# Patient Record
Sex: Female | Born: 1935 | Race: White | Hispanic: No | Smoking: Never smoker
Health system: Southern US, Community
[De-identification: ages and names within clinical notes are randomized; demographics above are authoritative.]

## PROBLEM LIST (undated history)

## (undated) DIAGNOSIS — F0390 Unspecified dementia without behavioral disturbance: Secondary | ICD-10-CM

## (undated) DIAGNOSIS — I10 Essential (primary) hypertension: Secondary | ICD-10-CM

## (undated) DIAGNOSIS — E785 Hyperlipidemia, unspecified: Secondary | ICD-10-CM

## (undated) DIAGNOSIS — H9190 Unspecified hearing loss, unspecified ear: Secondary | ICD-10-CM

## (undated) DIAGNOSIS — K219 Gastro-esophageal reflux disease without esophagitis: Secondary | ICD-10-CM

## (undated) DIAGNOSIS — E119 Type 2 diabetes mellitus without complications: Secondary | ICD-10-CM

## (undated) DIAGNOSIS — I2510 Atherosclerotic heart disease of native coronary artery without angina pectoris: Secondary | ICD-10-CM

---

## 2015-04-30 DIAGNOSIS — K219 Gastro-esophageal reflux disease without esophagitis: Secondary | ICD-10-CM | POA: Diagnosis present

## 2015-04-30 DIAGNOSIS — I2510 Atherosclerotic heart disease of native coronary artery without angina pectoris: Secondary | ICD-10-CM | POA: Diagnosis present

## 2015-04-30 DIAGNOSIS — S22000A Wedge compression fracture of unspecified thoracic vertebra, initial encounter for closed fracture: Secondary | ICD-10-CM | POA: Insufficient documentation

## 2015-04-30 DIAGNOSIS — E785 Hyperlipidemia, unspecified: Secondary | ICD-10-CM | POA: Diagnosis present

## 2017-12-09 ENCOUNTER — Emergency Department (HOSPITAL_BASED_OUTPATIENT_CLINIC_OR_DEPARTMENT_OTHER): Payer: Medicare Other

## 2017-12-09 ENCOUNTER — Other Ambulatory Visit: Payer: Self-pay

## 2017-12-09 ENCOUNTER — Encounter (HOSPITAL_BASED_OUTPATIENT_CLINIC_OR_DEPARTMENT_OTHER): Payer: Self-pay | Admitting: *Deleted

## 2017-12-09 ENCOUNTER — Inpatient Hospital Stay (HOSPITAL_BASED_OUTPATIENT_CLINIC_OR_DEPARTMENT_OTHER)
Admission: EM | Admit: 2017-12-09 | Discharge: 2017-12-12 | DRG: 871 | Disposition: A | Discharge status: Home / Self Care | Payer: Medicare Other | Attending: Internal Medicine | Admitting: Internal Medicine

## 2017-12-09 DIAGNOSIS — R011 Cardiac murmur, unspecified: Secondary | ICD-10-CM | POA: Diagnosis present

## 2017-12-09 DIAGNOSIS — H9190 Unspecified hearing loss, unspecified ear: Secondary | ICD-10-CM | POA: Diagnosis present

## 2017-12-09 DIAGNOSIS — I351 Nonrheumatic aortic (valve) insufficiency: Secondary | ICD-10-CM | POA: Diagnosis not present

## 2017-12-09 DIAGNOSIS — F0280 Dementia in other diseases classified elsewhere without behavioral disturbance: Secondary | ICD-10-CM | POA: Diagnosis present

## 2017-12-09 DIAGNOSIS — A419 Sepsis, unspecified organism: Secondary | ICD-10-CM | POA: Diagnosis present

## 2017-12-09 DIAGNOSIS — F05 Delirium due to known physiological condition: Secondary | ICD-10-CM | POA: Diagnosis present

## 2017-12-09 DIAGNOSIS — J9601 Acute respiratory failure with hypoxia: Secondary | ICD-10-CM | POA: Diagnosis present

## 2017-12-09 DIAGNOSIS — E739 Lactose intolerance, unspecified: Secondary | ICD-10-CM | POA: Diagnosis present

## 2017-12-09 DIAGNOSIS — R0902 Hypoxemia: Secondary | ICD-10-CM | POA: Diagnosis not present

## 2017-12-09 DIAGNOSIS — Z885 Allergy status to narcotic agent status: Secondary | ICD-10-CM | POA: Diagnosis not present

## 2017-12-09 DIAGNOSIS — Z79899 Other long term (current) drug therapy: Secondary | ICD-10-CM | POA: Diagnosis not present

## 2017-12-09 DIAGNOSIS — Z882 Allergy status to sulfonamides status: Secondary | ICD-10-CM | POA: Diagnosis not present

## 2017-12-09 DIAGNOSIS — I10 Essential (primary) hypertension: Secondary | ICD-10-CM | POA: Diagnosis present

## 2017-12-09 DIAGNOSIS — R112 Nausea with vomiting, unspecified: Secondary | ICD-10-CM | POA: Diagnosis present

## 2017-12-09 DIAGNOSIS — J189 Pneumonia, unspecified organism: Secondary | ICD-10-CM | POA: Diagnosis present

## 2017-12-09 DIAGNOSIS — Z888 Allergy status to other drugs, medicaments and biological substances status: Secondary | ICD-10-CM | POA: Diagnosis not present

## 2017-12-09 DIAGNOSIS — R197 Diarrhea, unspecified: Secondary | ICD-10-CM | POA: Diagnosis not present

## 2017-12-09 DIAGNOSIS — F0390 Unspecified dementia without behavioral disturbance: Secondary | ICD-10-CM | POA: Diagnosis not present

## 2017-12-09 DIAGNOSIS — R410 Disorientation, unspecified: Secondary | ICD-10-CM | POA: Diagnosis present

## 2017-12-09 DIAGNOSIS — G309 Alzheimer's disease, unspecified: Secondary | ICD-10-CM | POA: Diagnosis present

## 2017-12-09 HISTORY — DX: Essential (primary) hypertension: I10

## 2017-12-09 HISTORY — DX: Unspecified dementia, unspecified severity, without behavioral disturbance, psychotic disturbance, mood disturbance, and anxiety: F03.90

## 2017-12-09 LAB — CBC WITH DIFFERENTIAL/PLATELET
BASOS ABS: 0 10*3/uL (ref 0.0–0.1)
BASOS PCT: 0 %
Eosinophils Absolute: 0 10*3/uL (ref 0.0–0.7)
Eosinophils Relative: 0 %
HCT: 39.9 % (ref 36.0–46.0)
HEMOGLOBIN: 14 g/dL (ref 12.0–15.0)
Lymphocytes Relative: 2 %
Lymphs Abs: 0.3 10*3/uL — ABNORMAL LOW (ref 0.7–4.0)
MCH: 33 pg (ref 26.0–34.0)
MCHC: 35.1 g/dL (ref 30.0–36.0)
MCV: 94.1 fL (ref 78.0–100.0)
Monocytes Absolute: 0.8 10*3/uL (ref 0.1–1.0)
Monocytes Relative: 6 %
NEUTROS ABS: 13.5 10*3/uL — AB (ref 1.7–7.7)
NEUTROS PCT: 92 %
Platelets: 138 10*3/uL — ABNORMAL LOW (ref 150–400)
RBC: 4.24 MIL/uL (ref 3.87–5.11)
RDW: 12.2 % (ref 11.5–15.5)
WBC: 14.6 10*3/uL — ABNORMAL HIGH (ref 4.0–10.5)

## 2017-12-09 LAB — URINALYSIS, ROUTINE W REFLEX MICROSCOPIC
Bilirubin Urine: NEGATIVE
Glucose, UA: >=500 mg/dL — AB
KETONES UR: NEGATIVE mg/dL
LEUKOCYTES UA: NEGATIVE
NITRITE: NEGATIVE
Protein, ur: NEGATIVE mg/dL
SPECIFIC GRAVITY, URINE: 1.015 (ref 1.005–1.030)
pH: 7 (ref 5.0–8.0)

## 2017-12-09 LAB — COMPREHENSIVE METABOLIC PANEL
ALBUMIN: 3.9 g/dL (ref 3.5–5.0)
ALT: 16 U/L (ref 0–44)
ANION GAP: 10 (ref 5–15)
AST: 31 U/L (ref 15–41)
Alkaline Phosphatase: 68 U/L (ref 38–126)
BILIRUBIN TOTAL: 1.3 mg/dL — AB (ref 0.3–1.2)
BUN: 14 mg/dL (ref 8–23)
CHLORIDE: 95 mmol/L — AB (ref 98–111)
CO2: 27 mmol/L (ref 22–32)
Calcium: 8.7 mg/dL — ABNORMAL LOW (ref 8.9–10.3)
Creatinine, Ser: 0.62 mg/dL (ref 0.44–1.00)
GFR calc Af Amer: >60 mL/min (ref >60)
GLUCOSE: 208 mg/dL — AB (ref 70–99)
Potassium: 4.3 mmol/L (ref 3.5–5.1)
Sodium: 132 mmol/L — ABNORMAL LOW (ref 135–145)
TOTAL PROTEIN: 7.3 g/dL (ref 6.5–8.1)

## 2017-12-09 LAB — URINALYSIS, MICROSCOPIC (REFLEX)

## 2017-12-09 LAB — I-STAT CG4 LACTIC ACID, ED
LACTIC ACID, VENOUS: 1.09 mmol/L (ref 0.5–1.9)
Lactic Acid, Venous: 2.53 mmol/L (ref 0.5–1.9)

## 2017-12-09 LAB — LIPASE, BLOOD: LIPASE: 28 U/L (ref 11–51)

## 2017-12-09 LAB — TROPONIN I: TROPONIN I: 0.03 ng/mL — AB (ref <0.03)

## 2017-12-09 MED ORDER — AZITHROMYCIN 500 MG IV SOLR
INTRAVENOUS | Status: AC
  Filled 2017-12-09: qty 500

## 2017-12-09 MED ORDER — SODIUM CHLORIDE 0.9 % IV SOLN
INTRAVENOUS | Status: DC | PRN
  Administered 2017-12-09: 500 mL via INTRAVENOUS

## 2017-12-09 MED ORDER — SODIUM CHLORIDE 0.9 % IV BOLUS
1000.0000 mL | Freq: Once | INTRAVENOUS | Status: AC
  Administered 2017-12-09: 1000 mL via INTRAVENOUS

## 2017-12-09 MED ORDER — SODIUM CHLORIDE 0.9 % IV SOLN
INTRAVENOUS | Status: DC
  Administered 2017-12-10 – 2017-12-11 (×3): via INTRAVENOUS

## 2017-12-09 MED ORDER — ACETAMINOPHEN 325 MG PO TABS
650.0000 mg | ORAL_TABLET | Freq: Once | ORAL | Status: AC
  Administered 2017-12-09: 650 mg via ORAL
  Filled 2017-12-09: qty 2

## 2017-12-09 MED ORDER — SODIUM CHLORIDE 0.9 % IV SOLN
500.0000 mg | INTRAVENOUS | Status: DC
  Administered 2017-12-09 – 2017-12-12 (×3): 500 mg via INTRAVENOUS
  Filled 2017-12-09 (×3): qty 500

## 2017-12-09 MED ORDER — ENOXAPARIN SODIUM 40 MG/0.4ML ~~LOC~~ SOLN
40.0000 mg | Freq: Every day | SUBCUTANEOUS | Status: DC
  Administered 2017-12-10 – 2017-12-12 (×3): 40 mg via SUBCUTANEOUS
  Filled 2017-12-09 (×3): qty 0.4

## 2017-12-09 MED ORDER — SODIUM CHLORIDE 0.9 % IV SOLN
2.0000 g | INTRAVENOUS | Status: DC
  Administered 2017-12-09 – 2017-12-11 (×3): 2 g via INTRAVENOUS
  Filled 2017-12-09: qty 20
  Filled 2017-12-09: qty 2
  Filled 2017-12-09: qty 20

## 2017-12-09 NOTE — ED Provider Notes (Signed)
[facility] EMERGENCY DEPARTMENT Provider Note   CSN: [id number] Arrival date & time: 12/09/17  1754   LEVEL 5 CAVEAT - DEMENTIA   History   Chief Complaint Chief Complaint  Patient presents with  . Emesis    HPI Daci Stubbe is a 84 y.o. female.  HPI  84 year old female presents with vomiting.  History is taken from the daughter as the patient has early Alzheimer's dementia and has significant difficulty hearing.  The patient all of a sudden has been having cough, rhinorrhea, vomiting and diarrhea today.  Went to urgent care where she was given ondansetron and sent here.  Patient has not had any further vomiting since being given the medicine.  No known fevers.  The patient seems to be acting a little off today, while at urgent care seem to be trying to put 2 pieces of Kleenex together like they were puzzles and seems to keep searching for something.  This is not her typical behavior.  The patient seems to have some abdominal discomfort right before vomiting but otherwise has not been complaining of abdominal pain.  No complaints of headache.  Has had a couple episodes of diarrhea, usually following the vomiting.  Past Medical History:  Diagnosis Date  . Dementia   . Hypertension     Patient Active Problem List   Diagnosis Date Noted  . CAP (community acquired pneumonia) 12/09/2017  . Delirium 12/09/2017    History reviewed. No pertinent surgical history.   OB History   None      Home Medications    Prior to Admission medications   Medication Sig Start Date End Date Taking? Authorizing Provider  amLODipine (NORVASC) 10 MG tablet Take 10 mg by mouth daily.   Yes [provider]  cholecalciferol (VITAMIN D) 1000 units tablet Take 1,000 Units by mouth daily.   Yes [provider]  metoprolol tartrate (LOPRESSOR) 25 MG tablet Take 25 mg by mouth 2 (two) times daily.   Yes [provider]  Multiple Vitamins-Minerals (MULTIVITAMIN  ADULT) TABS Take 1 tablet by mouth daily.   Yes [provider]    Family History History reviewed. No pertinent family history.  Social History Social History   Tobacco Use  . Smoking status: Never Smoker  . Smokeless tobacco: Never Used  Substance Use Topics  . Alcohol use: Not Currently  . Drug use: Not Currently     Allergies   Ativan [lorazepam]; Lactose intolerance (gi); Seroquel [quetiapine fumarate]; Sulfa antibiotics; and Zoloft [sertraline hcl]   Review of Systems Review of Systems  Unable to perform ROS: Dementia     Physical Exam Updated Vital Signs BP (!) 126/51   Pulse (!) 55   Temp 97.6 F (36.4 C)   Resp 20   Ht 5\' 6"  (1.676 m)   Wt 59 kg   SpO2 99%   BMI 20.98 kg/m   Physical Exam  Constitutional: She appears well-developed and well-nourished. No distress.  HENT:  Head: Normocephalic and atraumatic.  Right Ear: External ear normal.  Left Ear: External ear normal.  Nose: Nose normal.  Eyes: Right eye exhibits no discharge. Left eye exhibits no discharge.  Cardiovascular: Normal rate and regular rhythm.  Murmur heard. Pulmonary/Chest: Effort normal. No accessory muscle usage. No tachypnea. She has rales in the right lower field and the left lower field.  Abdominal: Soft. There is no tenderness.  Neurological: She is alert.  Awake, alert, but there is limited orientation examination as the  patient does not focus on the exam. Moves all 4 extremities but does not participate in testing  Skin: Skin is warm and dry. She is not diaphoretic.  Nursing note and vitals reviewed.    ED Treatments / Results  Labs (all labs ordered are listed, but only abnormal results are displayed) Labs Reviewed  COMPREHENSIVE METABOLIC PANEL - Abnormal; Notable for the following components:      Result Value   Sodium 132 (*)    Chloride 95 (*)    Glucose, Bld 208 (*)    Calcium 8.7 (*)    Total Bilirubin 1.3 (*)    All other components within normal  limits  CBC WITH DIFFERENTIAL/PLATELET - Abnormal; Notable for the following components:   WBC 14.6 (*)    Platelets 138 (*)    Neutro Abs 13.5 (*)    Lymphs Abs 0.3 (*)    All other components within normal limits  URINALYSIS, ROUTINE W REFLEX MICROSCOPIC - Abnormal; Notable for the following components:   Glucose, UA >=500 (*)    Hgb urine dipstick TRACE (*)    All other components within normal limits  TROPONIN I - Abnormal; Notable for the following components:   Troponin I 0.03 (*)    All other components within normal limits  URINALYSIS, MICROSCOPIC (REFLEX) - Abnormal; Notable for the following components:   Bacteria, UA FEW (*)    All other components within normal limits  I-STAT CG4 LACTIC ACID, ED - Abnormal; Notable for the following components:   Lactic Acid, Venous 2.53 (*)    All other components within normal limits  CULTURE, BLOOD (ROUTINE X 2)  CULTURE, BLOOD (ROUTINE X 2)  EXPECTORATED SPUTUM ASSESSMENT W REFEX TO RESP CULTURE  GRAM STAIN  RESPIRATORY PANEL BY PCR  LIPASE, BLOOD  HIV ANTIBODY (ROUTINE TESTING W REFLEX)  STREP PNEUMONIAE URINARY ANTIGEN  INFLUENZA PANEL BY PCR (TYPE A & B)  CBC  BASIC METABOLIC PANEL  I-STAT CG4 LACTIC ACID, ED    EKG EKG Interpretation  Date/Time:  Wednesday December 09 2017 19:03:06 EDT Ventricular Rate:  88 PR Interval:    QRS Duration: 147 QT Interval:  402 QTC Calculation: 487 R Axis:   149 Text Interpretation:  Sinus rhythm Prolonged PR interval Prominent P waves, nondiagnostic Nonspecific intraventricular conduction delay ST depr, consider ischemia, anterior leads No old tracing to compare Confirmed by Pricilla LovelessGoldston, Diasia Henken [phone]) on 12/09/2017 7:07:57 PM   Radiology Dg Chest 2 View  Result Date: 12/09/2017 CLINICAL DATA:  Cough x1 day EXAM: CHEST - 2 VIEW COMPARISON:  05/31/2017 FINDINGS: Stable cardiomegaly with tortuous atherosclerotic aorta. Patchy bibasilar airspace opacity suspicious for bibasilar pneumonia  and atelectasis. No overt pulmonary edema. No pneumothorax. Trace pleural effusions suspected posteriorly. Degenerative changes are present about the dorsal spine. IMPRESSION: 1. Patchy bibasilar airspace opacity suspicious for pneumonia and/or atelectasis. 2. Suspect small posterior pleural effusions blunting the costophrenic angles. 3. Stable cardiomegaly with tortuous atherosclerotic aorta. Electronically Signed   By: Tollie Ethavid  Kwon M.D.   On: 12/09/2017 19:01   Ct Head Wo Contrast  Result Date: 12/09/2017 CLINICAL DATA:  Altered mental status EXAM: CT HEAD WITHOUT CONTRAST TECHNIQUE: Contiguous axial images were obtained from the base of the skull through the vertex without intravenous contrast. COMPARISON:  None. FINDINGS: BRAIN: There is sulcal and ventricular prominence consistent with moderate degree of superficial and central atrophy. No intraparenchymal hemorrhage, mass effect nor midline shift. Periventricular and subcortical white matter hypodensities consistent with moderate chronic appearing small vessel  ischemic disease are identified. No acute large vascular territory infarcts. No abnormal extra-axial fluid collections. Basal cisterns are not effaced and midline. VASCULAR: Moderate calcific atherosclerosis of the carotid siphons. SKULL: No skull fracture. No significant scalp soft tissue swelling. SINUSES/ORBITS: The mastoid air-cells are clear. The included paranasal sinuses are well-aerated.The included ocular globes and orbital contents are non-suspicious. OTHER: None. IMPRESSION: Atrophy with moderate degree of small vessel ischemia. No acute intracranial abnormality. Electronically Signed   By: Tollie Eth M.D.   On: 12/09/2017 19:00    Procedures .Critical Care Performed by: Pricilla Loveless, MD Authorized by: Pricilla Loveless, MD   Critical care provider statement:    Critical care time (minutes):  30   Critical care was necessary to treat or prevent imminent or life-threatening  deterioration of the following conditions:  Respiratory failure and sepsis   Critical care was time spent personally by me on the following activities:  Development of treatment plan with patient or surrogate, discussions with consultants, evaluation of patient's response to treatment, examination of patient, obtaining history from patient or surrogate, ordering and performing treatments and interventions, ordering and review of laboratory studies, ordering and review of radiographic studies, pulse oximetry, re-evaluation of patient's condition and review of old charts   (including critical care time)  Medications Ordered in ED Medications  cefTRIAXone (ROCEPHIN) 2 g in sodium chloride 0.9 % 100 mL IVPB ( Intravenous Stopped 12/09/17 2025)  azithromycin (ZITHROMAX) 500 mg in sodium chloride 0.9 % 250 mL IVPB (500 mg Intravenous New Bag/Given 12/09/17 2037)  azithromycin (ZITHROMAX) 500 MG injection (has no administration in time range)  0.9 %  sodium chloride infusion (500 mLs Intravenous New Bag/Given 12/09/17 1953)  0.9 %  sodium chloride infusion (has no administration in time range)  enoxaparin (LOVENOX) injection 40 mg (has no administration in time range)  sodium chloride 0.9 % bolus 1,000 mL ( Intravenous Stopped 12/09/17 2008)  acetaminophen (TYLENOL) tablet 650 mg (650 mg Oral Given 12/09/17 1946)     Initial Impression / Assessment and Plan / ED Course  I have reviewed the triage vital signs and the nursing notes.  Pertinent labs & imaging results that were available during my care of the patient were reviewed by me and considered in my medical decision making (see chart for details).     Patient is noted to be mildly hypoxic into the high 80s.  She has bilateral pneumonia on chest x-ray.  Lab work consistent with sepsis with lactic acidosis but no septic shock.  Thus she will need IV fluids and IV antibiotics.  I discussed this with patient's daughter who agrees to admit.  Dr. Julian Reil  will accept in transfer and admission to [facility].  Final Clinical Impressions(s) / ED Diagnoses   Final diagnoses:  Sepsis due to pneumonia [facility])    ED Discharge Orders    None       Pricilla Loveless, MD 12/10/17 0005

## 2017-12-09 NOTE — ED Notes (Signed)
Report given to [facility] david

## 2017-12-09 NOTE — ED Notes (Addendum)
O2 sats increased to 96% on 3L [place]

## 2017-12-09 NOTE — Plan of Care (Signed)
84 yo F with dementia, has CAP.  On 3L via [place].  Will send to med-surg.

## 2017-12-09 NOTE — ED Notes (Signed)
Phoned KIm daughter to given rm number

## 2017-12-09 NOTE — ED Notes (Signed)
Patient transported to CT 

## 2017-12-09 NOTE — ED Notes (Signed)
[facility]

## 2017-12-09 NOTE — ED Notes (Signed)
O2 sats 88-89% on RA while pt relaxed/sleeping; O2 applied at 3L/[place]

## 2017-12-09 NOTE — ED Triage Notes (Signed)
Pt sent here from UC for flu like symptoms , URI symptoms with n/v/d  X 1 day

## 2017-12-10 DIAGNOSIS — F0390 Unspecified dementia without behavioral disturbance: Secondary | ICD-10-CM | POA: Diagnosis present

## 2017-12-10 DIAGNOSIS — R197 Diarrhea, unspecified: Secondary | ICD-10-CM

## 2017-12-10 DIAGNOSIS — R0902 Hypoxemia: Secondary | ICD-10-CM

## 2017-12-10 DIAGNOSIS — R112 Nausea with vomiting, unspecified: Secondary | ICD-10-CM | POA: Diagnosis present

## 2017-12-10 DIAGNOSIS — R410 Disorientation, unspecified: Secondary | ICD-10-CM

## 2017-12-10 DIAGNOSIS — J189 Pneumonia, unspecified organism: Secondary | ICD-10-CM

## 2017-12-10 LAB — BRAIN NATRIURETIC PEPTIDE: B NATRIURETIC PEPTIDE 5: 403.9 pg/mL — AB (ref 0.0–100.0)

## 2017-12-10 LAB — CBC
HEMATOCRIT: 32.5 % — AB (ref 36.0–46.0)
HEMOGLOBIN: 10.8 g/dL — AB (ref 12.0–15.0)
MCH: 32.2 pg (ref 26.0–34.0)
MCHC: 33.2 g/dL (ref 30.0–36.0)
MCV: 97 fL (ref 78.0–100.0)
Platelets: 147 10*3/uL — ABNORMAL LOW (ref 150–400)
RBC: 3.35 MIL/uL — ABNORMAL LOW (ref 3.87–5.11)
RDW: 12.6 % (ref 11.5–15.5)
WBC: 13.4 10*3/uL — AB (ref 4.0–10.5)

## 2017-12-10 LAB — BASIC METABOLIC PANEL
ANION GAP: 7 (ref 5–15)
BUN: 14 mg/dL (ref 8–23)
CALCIUM: 8.1 mg/dL — AB (ref 8.9–10.3)
CHLORIDE: 102 mmol/L (ref 98–111)
CO2: 28 mmol/L (ref 22–32)
CREATININE: 0.76 mg/dL (ref 0.44–1.00)
GFR calc non Af Amer: >60 mL/min (ref >60)
Glucose, Bld: 115 mg/dL — ABNORMAL HIGH (ref 70–99)
Potassium: 4.6 mmol/L (ref 3.5–5.1)
SODIUM: 137 mmol/L (ref 135–145)

## 2017-12-10 LAB — RESPIRATORY PANEL BY PCR
ADENOVIRUS-RVPPCR: NOT DETECTED
Bordetella pertussis: NOT DETECTED
CHLAMYDOPHILA PNEUMONIAE-RVPPCR: NOT DETECTED
CORONAVIRUS 229E-RVPPCR: NOT DETECTED
CORONAVIRUS HKU1-RVPPCR: NOT DETECTED
CORONAVIRUS NL63-RVPPCR: NOT DETECTED
Coronavirus OC43: NOT DETECTED
Influenza A: NOT DETECTED
Influenza B: NOT DETECTED
MYCOPLASMA PNEUMONIAE-RVPPCR: NOT DETECTED
Metapneumovirus: NOT DETECTED
PARAINFLUENZA VIRUS 3-RVPPCR: NOT DETECTED
Parainfluenza Virus 1: NOT DETECTED
Parainfluenza Virus 2: NOT DETECTED
Parainfluenza Virus 4: NOT DETECTED
Respiratory Syncytial Virus: NOT DETECTED
Rhinovirus / Enterovirus: NOT DETECTED

## 2017-12-10 LAB — HIV ANTIBODY (ROUTINE TESTING W REFLEX): HIV SCREEN 4TH GENERATION: NONREACTIVE

## 2017-12-10 LAB — PROCALCITONIN: PROCALCITONIN: 5.9 ng/mL

## 2017-12-10 LAB — INFLUENZA PANEL BY PCR (TYPE A & B)
INFLAPCR: NEGATIVE
INFLBPCR: NEGATIVE

## 2017-12-10 MED ORDER — ONDANSETRON HCL 4 MG/2ML IJ SOLN: 4.0000 mg | Freq: Four times a day (QID) | INTRAMUSCULAR | Status: DC | PRN

## 2017-12-10 MED ORDER — IPRATROPIUM-ALBUTEROL 0.5-2.5 (3) MG/3ML IN SOLN
3.0000 mL | Freq: Four times a day (QID) | RESPIRATORY_TRACT | Status: DC
  Administered 2017-12-10 (×3): 3 mL via RESPIRATORY_TRACT
  Filled 2017-12-10 (×3): qty 3

## 2017-12-10 MED ORDER — IPRATROPIUM-ALBUTEROL 0.5-2.5 (3) MG/3ML IN SOLN: 3.0000 mL | Freq: Three times a day (TID) | RESPIRATORY_TRACT | Status: DC

## 2017-12-10 MED ORDER — SODIUM CHLORIDE 3 % IN NEBU
4.0000 mL | INHALATION_SOLUTION | Freq: Three times a day (TID) | RESPIRATORY_TRACT | Status: DC
  Administered 2017-12-10 – 2017-12-12 (×6): 4 mL via RESPIRATORY_TRACT
  Filled 2017-12-10 (×8): qty 4

## 2017-12-10 MED ORDER — GUAIFENESIN ER 600 MG PO TB12
1200.0000 mg | ORAL_TABLET | Freq: Two times a day (BID) | ORAL | Status: DC
  Administered 2017-12-10 – 2017-12-12 (×4): 1200 mg via ORAL
  Filled 2017-12-10 (×4): qty 2

## 2017-12-10 MED ORDER — ALBUTEROL SULFATE (2.5 MG/3ML) 0.083% IN NEBU: 2.5000 mg | INHALATION_SOLUTION | RESPIRATORY_TRACT | Status: DC | PRN

## 2017-12-10 NOTE — Progress Notes (Signed)
Order received for swallow evaluation.  Will see patient next date.Marland Kitchen.Apologize for delay.   Donavan Burnetamara Patsy Varma, MS [facility] SLP Acute Rehab Services Pager [phone] Office [phone]

## 2017-12-10 NOTE — Progress Notes (Signed)
Anne Miller is a 84 y.o. female with medical history significant of HTN, mild Alzheimer's dementia, hard of hearing.  At baseline she has 24h care at home from 2 CNAs.  At baseline she ambulates and likes to do puzzles. Acute onset of cough, rhinorrhea, vomiting, diarrhea.  Family also noted confusion. Abd discomfort right before vomiting otherwise. Patient taken to UC where they gave her "a pill under her tongue" (probably a zofran ODT I suspect) no vomiting since that time and [facility].  Chest x-ray revealed bilateral pleural infiltrates with suspicion for community-acquired pneumonia.  Elevated lactic acid on presentation, febrile with T-max 100.5, acute hypoxia, leukocytosis with WBC 14 K.  Promptly started on IV Rocephin and IV azithromycin and admitted for CAP.  12/11/2017: Patient seen and examined with her CNA at bedside.  Per CNA, the patient intermittently has coughing spells when she eats.  Speech therapy consulted to assess for dysphagia.  Procalcitonin ordered this morning greater than 5.  Continue IV antibiotics.  Respiratory viral panel pending.  Pulmonary toilet added.  Continue O2 supplementation to maintain O2 saturation greater than 92%.  Please refer to H&P dictated by Dr. Julian Miller on 12/10/2017 for further details of the assessment and plan.

## 2017-12-10 NOTE — Progress Notes (Signed)
Paged hospitalist

## 2017-12-10 NOTE — H&P (Signed)
History and Physical    Anne Miller ZOX:[id number] DOB: 10/16/1918 DOA: 12/09/2017  PCP: Wilburn MylarKelly, Samuel S, MD  Patient coming from: Home  I have personally briefly [facility]  Chief Complaint: Emesis, AMS  HPI: Anne PulseMary Ann Miller is a 84 y.o. female with medical history significant of HTN, mild Alzheimer's dementia, hard of hearing.  At baseline she has 24h care at home from 2 CNAs.  At baseline she ambulates and likes to do puzzels.  Today patient had acute onset of cough, rhinorrhea, vomiting, diarrhea.  Family also noted confusion: for example trying to put 2 pieces of Kleenex together like they were a puzzle and seemed to keep searching for something (not her baseline behavior).  Abd discomfort right before vomiting otherwise no abd pain complaints.  No headache.  Patient taken to UC where they gave her "a pill under her tongue" (probably a zofran ODT I suspect) no vomiting since that time and [facility].   ED Course: In the ED patient noted to be febrile to 100.5, new O2 requirement of 2L as she was "in the high 80s" on room air per EDP note.  WBC 14.6k.  CXR shows B lower lobe PNA.  Lactate initially 2.5, improved to 1.0 after 1L IVF.  Patient put on rocephin / azithro.   Review of Systems: Unable to perform due to dementia / delirium.  Past Medical History:  Diagnosis Date  . Dementia   . Hypertension     History reviewed. No pertinent surgical history.   reports that she has never smoked. She has never used smokeless tobacco. She reports that she drank alcohol. She reports that she has current or past drug history.  Allergies  Allergen Reactions  . Ativan [Lorazepam]     Caused delirium  . Lactose Intolerance (Gi)     Vomiting and diarrhea  . Seroquel [Quetiapine Fumarate]     Caused delirium  . Sulfa Antibiotics   . Zoloft [Sertraline Hcl]     Caused delirium    History reviewed. No pertinent  family history. Daughter did have one day N/V illness of similar symptoms, but this was 2 weeks ago.  No other sick contacts.  Prior to Admission medications   Medication Sig Start Date End Date Taking? Authorizing Provider  amLODipine (NORVASC) 10 MG tablet Take 10 mg by mouth daily.   Yes [provider]  cholecalciferol (VITAMIN D) 1000 units tablet Take 1,000 Units by mouth daily.   Yes [provider]  metoprolol tartrate (LOPRESSOR) 25 MG tablet Take 25 mg by mouth 2 (two) times daily.   Yes [provider]  Multiple Vitamins-Minerals (MULTIVITAMIN ADULT) TABS Take 1 tablet by mouth daily.   Yes [provider]    Physical Exam: Vitals:   12/09/17 2030 12/09/17 2100 12/09/17 2115 12/09/17 2254  BP: (!) 113/52 (!) 100/50  (!) 126/51  Pulse: 75 76 73 (!) 55  Resp: (!) 26 (!) 26 (!) 26 20  Temp:    97.6 F (36.4 C)  TempSrc:      SpO2: 96% 95% 97% 99%  Weight:      Height:        Constitutional: NAD, calm, comfortable Eyes: PERRL, lids and conjunctivae normal ENMT: Mucous membranes are moist. Posterior pharynx clear of any exudate or lesions.Normal dentition.  Neck: normal, supple, no masses, no thyromegaly Respiratory: Rales B lower lungs Cardiovascular: Regular rate and rhythm,  no murmurs / rubs / gallops. No extremity edema. 2+ pedal pulses. No carotid bruits.  Abdomen: no tenderness, no masses palpated. No hepatosplenomegaly. Bowel sounds positive.  Musculoskeletal: no clubbing / cyanosis. No joint deformity upper and lower extremities. Good ROM, no contractures. Normal muscle tone.  Skin: no rashes, lesions, ulcers. No induration Neurologic: MAE, doesn't participate in testing, no obvious facial droop or other focal deficit. Psychiatric: Awake, alert, limited orientation   Labs on Admission: I have personally reviewed following labs and imaging studies  CBC: Recent Labs  Lab 12/09/17 1902  WBC 14.6*  NEUTROABS 13.5*  HGB 14.0    HCT 39.9  MCV 94.1  PLT 138*   Basic Metabolic Panel: Recent Labs  Lab 12/09/17 1902  NA 132*  K 4.3  CL 95*  CO2 27  GLUCOSE 208*  BUN 14  CREATININE 0.62  CALCIUM 8.7*   GFR: Estimated Creatinine Clearance: 38.3 mL/min (by C-G formula based on SCr of 0.62 mg/dL). Liver Function Tests: Recent Labs  Lab 12/09/17 1902  AST 31  ALT 16  ALKPHOS 68  BILITOT 1.3*  PROT 7.3  ALBUMIN 3.9   Recent Labs  Lab 12/09/17 1902  LIPASE 28   No results for input(s): AMMONIA in the last 168 hours. Coagulation Profile: No results for input(s): INR, PROTIME in the last 168 hours. Cardiac Enzymes: Recent Labs  Lab 12/09/17 1902  TROPONINI 0.03*   BNP (last 3 results) No results for input(s): PROBNP in the last 8760 hours. HbA1C: No results for input(s): HGBA1C in the last 72 hours. CBG: No results for input(s): GLUCAP in the last 168 hours. Lipid Profile: No results for input(s): CHOL, HDL, LDLCALC, TRIG, CHOLHDL, LDLDIRECT in the last 72 hours. Thyroid Function Tests: No results for input(s): TSH, T4TOTAL, FREET4, T3FREE, THYROIDAB in the last 72 hours. Anemia Panel: No results for input(s): VITAMINB12, FOLATE, FERRITIN, TIBC, IRON, RETICCTPCT in the last 72 hours. Urine analysis:    Component Value Date/Time   COLORURINE YELLOW 12/09/2017 1938   APPEARANCEUR CLEAR 12/09/2017 1938   LABSPEC 1.015 12/09/2017 1938   PHURINE 7.0 12/09/2017 1938   GLUCOSEU >=500 (A) 12/09/2017 1938   HGBUR TRACE (A) 12/09/2017 1938   BILIRUBINUR NEGATIVE 12/09/2017 1938   KETONESUR NEGATIVE 12/09/2017 1938   PROTEINUR NEGATIVE 12/09/2017 1938   NITRITE NEGATIVE 12/09/2017 1938   LEUKOCYTESUR NEGATIVE 12/09/2017 1938    Radiological Exams on Admission: Dg Chest 2 View  Result Date: 12/09/2017 CLINICAL DATA:  Cough x1 day EXAM: CHEST - 2 VIEW COMPARISON:  05/31/2017 FINDINGS: Stable cardiomegaly with tortuous atherosclerotic aorta. Patchy bibasilar airspace opacity suspicious for  bibasilar pneumonia and atelectasis. No overt pulmonary edema. No pneumothorax. Trace pleural effusions suspected posteriorly. Degenerative changes are present about the dorsal spine. IMPRESSION: 1. Patchy bibasilar airspace opacity suspicious for pneumonia and/or atelectasis. 2. Suspect small posterior pleural effusions blunting the costophrenic angles. 3. Stable cardiomegaly with tortuous atherosclerotic aorta. Electronically Signed   By: Tollie Eth M.D.   On: 12/09/2017 19:01   Ct Head Wo Contrast  Result Date: 12/09/2017 CLINICAL DATA:  Altered mental status EXAM: CT HEAD WITHOUT CONTRAST TECHNIQUE: Contiguous axial images were obtained from the base of the skull through the vertex without intravenous contrast. COMPARISON:  None. FINDINGS: BRAIN: There is sulcal and ventricular prominence consistent with moderate degree of superficial and central atrophy. No intraparenchymal hemorrhage, mass effect nor midline shift. Periventricular and subcortical white matter hypodensities consistent with moderate chronic appearing small vessel ischemic disease are identified. No  acute large vascular territory infarcts. No abnormal extra-axial fluid collections. Basal cisterns are not effaced and midline. VASCULAR: Moderate calcific atherosclerosis of the carotid siphons. SKULL: No skull fracture. No significant scalp soft tissue swelling. SINUSES/ORBITS: The mastoid air-cells are clear. The included paranasal sinuses are well-aerated.The included ocular globes and orbital contents are non-suspicious. OTHER: None. IMPRESSION: Atrophy with moderate degree of small vessel ischemia. No acute intracranial abnormality. Electronically Signed   By: Tollie Eth M.D.   On: 12/09/2017 19:00    EKG: Independently reviewed.  Assessment/Plan Principal Problem:   CAP (community acquired pneumonia) Active Problems:   Delirium   Nausea vomiting and diarrhea   Hypoxia    1. CAP - 1. PNA pathway 2. Rocephin /  azithromycin 3. IVF: NS at 77 4. Repeat BMP in AM 5. RSV and influenza pnl pending 2. Hypoxia - due to PNA, O2 via [place] as needed, currently on 2L 3. N/V/D - 1. Zofran PRN 2. Presumably viral illness or associated with PNA 3. Clear liquid diet and advance as tolerated. 4. Acute delirium on chronic dementia - 1. Patient with new onset confusion today as in HPI 2. Family does NOT want psychotropic meds used, they say these make her delirium worse in past.  DVT prophylaxis: Lovenox Code Status: Intubate if needed for respiratory issues, DNR if heart stops though per daughter in law Family Communication: Daughter in [place] at bedside Disposition Plan: Home after admit Consults called: None Admission status: Admit to inpatient - IP status due to PNA with new O2 requirement, delirium.  Her PORT score is also should qualify her for inpatient status.   Hillary Bow DO [facility] Pager [phone] Only works nights!  If 7AM-7PM, please contact the primary day team physician taking care of patient  [email or web address] Password [email or web address]  12/10/2017, 12:15 AM

## 2017-12-11 ENCOUNTER — Inpatient Hospital Stay (HOSPITAL_COMMUNITY): Payer: Medicare Other

## 2017-12-11 DIAGNOSIS — I351 Nonrheumatic aortic (valve) insufficiency: Secondary | ICD-10-CM

## 2017-12-11 DIAGNOSIS — A419 Sepsis, unspecified organism: Principal | ICD-10-CM

## 2017-12-11 LAB — CBC
HEMATOCRIT: 33.1 % — AB (ref 36.0–46.0)
HEMOGLOBIN: 11 g/dL — AB (ref 12.0–15.0)
MCH: 32.5 pg (ref 26.0–34.0)
MCHC: 33.2 g/dL (ref 30.0–36.0)
MCV: 97.9 fL (ref 78.0–100.0)
Platelets: 149 10*3/uL — ABNORMAL LOW (ref 150–400)
RBC: 3.38 MIL/uL — ABNORMAL LOW (ref 3.87–5.11)
RDW: 12.7 % (ref 11.5–15.5)
WBC: 8.6 10*3/uL (ref 4.0–10.5)

## 2017-12-11 LAB — ECHOCARDIOGRAM COMPLETE
HEIGHTINCHES: 66 in
Weight: 2080 oz

## 2017-12-11 LAB — PROCALCITONIN: PROCALCITONIN: 4.23 ng/mL

## 2017-12-11 LAB — LACTIC ACID, PLASMA: Lactic Acid, Venous: 1.2 mmol/L (ref 0.5–1.9)

## 2017-12-11 MED ORDER — IPRATROPIUM-ALBUTEROL 0.5-2.5 (3) MG/3ML IN SOLN
3.0000 mL | Freq: Four times a day (QID) | RESPIRATORY_TRACT | Status: DC
  Administered 2017-12-11 (×3): 3 mL via RESPIRATORY_TRACT
  Filled 2017-12-11 (×3): qty 3

## 2017-12-11 MED ORDER — ADULT MULTIVITAMIN W/MINERALS CH
1.0000 | ORAL_TABLET | Freq: Every day | ORAL | Status: DC
  Administered 2017-12-11 – 2017-12-12 (×2): 1 via ORAL
  Filled 2017-12-11 (×2): qty 1

## 2017-12-11 MED ORDER — VITAMIN D3 25 MCG (1000 UNIT) PO TABS
1000.0000 [IU] | ORAL_TABLET | Freq: Every day | ORAL | Status: DC
  Administered 2017-12-11 – 2017-12-12 (×2): 1000 [IU] via ORAL
  Filled 2017-12-11 (×2): qty 1

## 2017-12-11 MED ORDER — METOPROLOL TARTRATE 25 MG PO TABS
25.0000 mg | ORAL_TABLET | Freq: Two times a day (BID) | ORAL | Status: DC
  Administered 2017-12-11 – 2017-12-12 (×3): 25 mg via ORAL
  Filled 2017-12-11 (×3): qty 1

## 2017-12-11 MED ORDER — AMLODIPINE BESYLATE 10 MG PO TABS
10.0000 mg | ORAL_TABLET | Freq: Every day | ORAL | Status: DC
  Administered 2017-12-11 – 2017-12-12 (×2): 10 mg via ORAL
  Filled 2017-12-11 (×2): qty 1

## 2017-12-11 MED ORDER — IPRATROPIUM-ALBUTEROL 0.5-2.5 (3) MG/3ML IN SOLN
3.0000 mL | Freq: Three times a day (TID) | RESPIRATORY_TRACT | Status: DC
  Administered 2017-12-12: 3 mL via RESPIRATORY_TRACT
  Filled 2017-12-11: qty 3

## 2017-12-11 NOTE — Progress Notes (Signed)
  Echocardiogram 2D Echocardiogram has been performed.  Leta JunglingCooper, Detron Carras M 12/11/2017, 10:13 AM

## 2017-12-11 NOTE — Progress Notes (Signed)
12/11/17  1845  Notified Dr Margo AyeHall that patient requesting something to help decrease her diarrhea. Patient has had at least 3 loose BM's. Per MD we may need to test stool first. Waiting for orders.

## 2017-12-11 NOTE — Evaluation (Addendum)
Physical Therapy 1x Evaluation Patient Details Name: Anne PulseMary Ann Gunkel MRN: [id number] DOB: [birth year] Today's Date: 12/11/2017   History of Present Illness  84 y.o. female with medical history significant of HTN, mild Alzheimer's dementia, hard of hearing.  At baseline she has 24h care at home from 2 CNAs; ambulates HHA at home or with RW (alwasy someone with her) and likes to do puzzles. Presented with N/V.  Dx pna, acute confusion..h/o hip fracture in the past.   Clinical Impression  Pt in with above complaints with daughter in law and caregiver at bedside during assessment. Daughter in law provided the PLF and history. They have 24 hour , 7 day a week caregivers with a schedule for eating, exercises and walking 3 times a day. Pt uses RW most of the time, however at times, she also uses hand held assist with some caregivers. She walks outside with assistance. Today pt tolerated session well ambulating in the hallway on RW maintaining above 94% , and when she sat down it continued to range from 96%-99%. Educated with pursed lip breathing as well. Encourage staff to ambulate with patient to bathroom and/or in the hallway. No further PT needs at this time.   SATURATION QUALIFICATIONS: (This note is used to comply with regulatory documentation for home oxygen)  Patient Saturations on Room Air at Rest = 96%  Patient Saturations on Room Air while Ambulating = 94-96%      Follow Up Recommendations No PT follow up    Equipment Recommendations  None recommended by PT    Recommendations for Other Services       Precautions / Restrictions Precautions Precautions: Fall Precaution Comments: discussed with family that I could put a chair alarm under her while in recliner, they declined due to they will be by herside at all times.  Restrictions Weight Bearing Restrictions: No      Mobility  Bed Mobility Overal bed mobility: Needs Assistance Bed Mobility: Supine to Sit     Supine to sit:  Min guard     General bed mobility comments: very little assistance required , mostly for guidance on task asked due to [facility].   Transfers Overall transfer level: Needs assistance Equipment used: Rolling walker (2 wheeled) Transfers: Sit to/from Stand Sit to Stand: Min guard         General transfer comment: She did "plop" a little on the descent with a little less control and daughter in law aware and stated she cues her for this at times at home.   Ambulation/Gait Ambulation/Gait assistance: Min guard Gait Distance (Feet): 80 Feet Assistive device: Rolling walker (2 wheeled) Gait Pattern/deviations: Step-through pattern     General Gait Details: slightly narrow base of support, and small steps, however was able to vary speed from fairly good pace to slower pace (only due to trying to listen , observe surroundings at the same time she was walking). Did not relie on RW very much.   Stairs            Wheelchair Mobility    Modified [place] (Stroke Patients Only)       Balance Overall balance assessment: History of Falls;No apparent balance deficits (not formally assessed)(due to history of fall, age and dementia , familyis alwasy by her side for any mobility. Unable to formally assess due to difficulty with hearing unique cues for testing. No loss of balance noted during our assessment of walking in hallway with one turn )  Pertinent Vitals/Pain Pain Assessment: Faces Faces Pain Scale: No hurt    Home Living Family/patient expects to be discharged to:: Private residence Living Arrangements: Other (Comment)(pt has caregivers 24/7 and family that also checks on pateint ) Available Help at Discharge: Available 24 hours/day;Personal care attendant(CNAs  24/7) Type of Home: House Home Access: Stairs to enter Entrance Stairs-Rails: Right Entrance Stairs-Number of Steps: 2-3(caregivers state she does not have any  difficulty with steps PTA and don't forsee any difficulty upon discharge ) Home Layout: One level Home Equipment: Walker - 2 wheels      Prior Function Level of Independence: Needs assistance   Gait / Transfers Assistance Needed: someone is always with the pateint during mobility and any movment 24hrous a day. They have a schedule of exercises and a schedule for walking the patient outside 3 times a day.   ADL's / Homemaking Assistance Needed: assistance and guidance due to dementia         Hand Dominance        Extremity/Trunk Assessment        Lower Extremity Assessment Lower Extremity Assessment: Overall WFL for tasks assessed(due to difficulty hearing special tests, just basic gross assessment furing functional movment WFL)       Communication   Communication: HOH(hearing aide in right ear.)  Cognition Arousal/Alertness: Awake/alert Behavior During Therapy: WFL for tasks assessed/performed(due to [facility] was still very pleasant and able to follow and hear simple commnads and hand gestures ) Overall Cognitive Status: History of cognitive impairments - at baseline(still able to complete functional tasks and follow commands. Does best with demonstration so she can mimic you)                                        General Comments      Exercises Other Exercises Other Exercises: duaghter in law was telling and showed my some sitting and standing exercises that she does with the pateint. Still all very good and appropriate for her to continue. Added the pursed lip breathing exercises, and patient performed with an increase in 02 sats after 3 times. from 96% RA to 99% RA .    Assessment/Plan    PT Assessment Patent does not need any further PT services(family has a great set up for patient, exercises and walkign program. They are fine with pt's ability and will continue with their program. No further needs )  PT Problem List         PT Treatment  Interventions      PT Goals (Current goals can be found in the Care Plan section)  Acute Rehab PT Goals Patient Stated Goal: Pt and family want to return home  PT Goal Formulation: All assessment and education complete, DC therapy    Frequency     Barriers to discharge        Co-evaluation               AM-PAC PT "6 Clicks" Daily Activity  Outcome Measure Difficulty turning over in bed (including adjusting bedclothes, sheets and blankets)?: A Little Difficulty moving from lying on back to sitting on the side of the bed? : A Little Difficulty sitting down on and standing up from a chair with arms (e.g., wheelchair, bedside commode, etc,.)?: A Little Help needed moving to and from a bed to chair (including a wheelchair)?: A Little Help needed walking in hospital room?:  A Little Help needed climbing 3-5 steps with a railing? : A Little 6 Click Score: 18    End of Session Equipment Utilized During Treatment: Gait belt Activity Tolerance: Patient tolerated treatment well Patient left: in chair;with family/visitor present(no chair alarm placed due to family declined due to being in the room with her 24/7. ) Nurse Communication: Mobility status(also alerted nurse of pt 02 sats on RA staying above 94-96%. Left pt on RA for nursing to recheck. ) PT Visit Diagnosis: Muscle weakness (generalized) (M62.81)    Time: 1610-9604 PT Time Calculation (min) (ACUTE ONLY): 18 min   Charges:   PT Evaluation $PT Eval Low Complexity: 1 Low          Marella Bile, PT Acute Rehabilitation Services Pager: [phone] Office: [phone] 12/11/2017   Marella Bile 12/11/2017, 3:46 PM

## 2017-12-11 NOTE — Evaluation (Signed)
Clinical/Bedside Swallow Evaluation Patient Details  Name: Juliann PulseMary Ann Kofoed MRN: [id number] Date of Birth: 1918-07-25  Today's Date: 12/11/2017 Time: SLP Start Time (ACUTE ONLY): 1053 SLP Stop Time (ACUTE ONLY): 1110 SLP Time Calculation (min) (ACUTE ONLY): 17 min  Past Medical History:  Past Medical History:  Diagnosis Date  . Dementia   . Hypertension    Past Surgical History: History reviewed. No pertinent surgical history. HPI:  84 y.o. female with medical history significant of HTN, mild Alzheimer's dementia, hard of hearing.  At baseline she has 24h care at home from 2 CNAs; ambulates and likes to do puzzles. Presented with N/V.  Dx pna, acute confusion. 9/20 noted to be coughing when eating.  SLP swallow assessment ordered.    Assessment / Plan / Recommendation Clinical Impression  Pt presents with functional oropharyngeal swallow with adequate oral attention, anticipation, and oral preparation; brisk swallow response; no overt s/s of aspiration with any consistencies.  No barriers to diet progression noted.  No dysphagia.  Recommend advancing diet to regular when MD deems appropriate.  SLP will sign off.  SLP Visit Diagnosis: Dysphagia, unspecified (R13.10)    Aspiration Risk  No limitations    Diet Recommendation    Per MD - remains on clears but is not dysphagic  Medication Administration: Whole meds with liquid    Other  Recommendations Oral Care Recommendations: Oral care BID   Follow up Recommendations None      Frequency and Duration            Prognosis        Swallow Study   General Date of Onset: 12/09/17 HPI: 84 y.o. female with medical history significant of HTN, mild Alzheimer's dementia, hard of hearing.  At baseline she has 24h care at home from 2 CNAs; ambulates and likes to do puzzles. Presented with N/V.  Dx pna, acute confusion. 9/20 noted to be coughing when eating.  SLP swallow assessment ordered.  Type of Study: Bedside Swallow  Evaluation Previous Swallow Assessment: no Diet Prior to this Study: Thin liquids Temperature Spikes Noted: No Respiratory Status: Nasal cannula History of Recent Intubation: No Behavior/Cognition: Alert;Cooperative Oral Cavity Assessment: Within Functional Limits Oral Care Completed by SLP: No Oral Cavity - Dentition: Adequate natural dentition Vision: Functional for self-feeding Self-Feeding Abilities: Able to feed self Patient Positioning: Upright in bed Baseline Vocal Quality: Normal Volitional Cough: Strong Volitional Swallow: Able to elicit    Oral/Motor/Sensory Function Overall Oral Motor/Sensory Function: Within functional limits   Ice Chips Ice chips: Within functional limits   Thin Liquid Thin Liquid: Within functional limits    Nectar Thick Nectar Thick Liquid: Not tested   Honey Thick Honey Thick Liquid: Not tested   Puree Puree: Within functional limits   Solid     Solid: Within functional limits      Blenda MountsCouture, Ronae Noell Laurice 12/11/2017,11:14 AM

## 2017-12-11 NOTE — Plan of Care (Signed)
  Problem: Nutrition: Goal: Adequate nutrition will be maintained Outcome: Progressing   Problem: Safety: Goal: Ability to remain free from injury will improve Outcome: Progressing   Problem: Activity: Goal: Risk for activity intolerance will decrease Outcome: Progressing   

## 2017-12-11 NOTE — Progress Notes (Addendum)
PROGRESS NOTE  Anne Miller ZOX:[id number] DOB: 1919-01-24 DOA: 12/09/2017 PCP: Anne Miller, Samuel S, MD  HPI/Recap of past 24 hours: Anne AbrahamsMary Ann Bakeris a 84 y.o.femalewith medical history significant ofHTN, mild Alzheimer's dementia, hard of hearing. At baseline she has 24h care at home from 2 CNAs. At baseline she ambulates and likes to do puzzles. Acute onset of cough, rhinorrhea, vomiting, diarrhea. Family also noted confusion. Abd discomfort right before vomiting otherwise. Patient taken to UC where they gave her "a pill under her tongue" (probably a zofran ODT I suspect) no vomiting since that time and [facility]  Chest x-ray revealed bilateral pleural infiltrates with suspicion for community-acquired pneumonia.  Elevated lactic acid on presentation, febrile with T-max 100.5, acute hypoxia, leukocytosis with WBC 14 K.  Promptly started on IV Rocephin and IV azithromycin and admitted for CAP.  12/11/2017: Patient seen and examined with her CNA at bedside.  Per CNA, the patient intermittently has coughing spells when she eats.  Speech therapy consulted to assess for dysphagia.  Procalcitonin ordered this morning greater than 5.  Continue IV antibiotics.  Respiratory viral panel pending.  Pulmonary toilet added.  Continue O2 supplementation to maintain O2 saturation greater than 92%.          12/12/2017: Patient seen and examined with her CNA at bedside.  She is very hard of hearing.  Per CNA her cough is improving.  Procalcitonin still elevated at 4 however is trending down from 5.  Passed a swallow evaluation.  2D echo has been completed.    Assessment/Plan: Principal Problem:   CAP (community acquired pneumonia) Active Problems:   Delirium   Nausea vomiting and diarrhea   Hypoxia   Dementia  Acute hypoxic respiratory failure secondary to CAP Urine Legionella and strep antigen pending Respiratory viral panel negative Significantly elevated procalcitonin 4.23  from 5.90 Repeat procalcitonin level in the morning Continue broad-spectrum IV antibiotics empirically On IV ceftriaxone and IV azithromycin Sputum culture pending Continue DuoNeb every 6 hours and every 2 hours as needed Continue hypersaline nebs and Mucinex for pulmonary toilet Home O2 evaluation Supplemental oxygen to maintain O2 saturation 92% or greater  Sepsis secondary to community-acquired pneumonia Management as stated above Home antihypertensive medications initially held due to sepsis physiology Sepsis physiology is resolving Continue to monitor fever curve Continue to monitor WBC Obtain CBC in the morning  Grade 3 out of 6 systolic murmur with abnormal EKG Elevated BNP 403 2D echo done and pending  Hypertension Initially held due to sepsis Resume amlodipine 10 mg daily and metoprolol 25 mg twice daily Continue to monitor vital signs    Code Status: Partial  Family Communication: CNA at bedside  Disposition Plan: Home possibly tomorrow 12/12/17 when clinically improved   Consultants: None Procedures:  None  Antimicrobials:  IV ceftriaxone and IV azithromycin  DVT prophylaxis: Subcu Lovenox daily   Objective: Vitals:   12/11/17 0510 12/11/17 0734 12/11/17 1046 12/11/17 1301  BP: (!) 151/67  (!) 161/66   Miller: 73  93   Resp: (!) 22  16   Temp: 98.6 F (37 C)     TempSrc: Oral     SpO2: 95% 96% 98% 97%  Weight:      Height:        Intake/Output Summary (Last 24 hours) at 12/11/2017 1307 Last data filed at 12/10/2017 2211 Gross per 24 hour  Intake 600 ml  Output -  Net 600 ml   [employer] Weights  12/09/17 1800  Weight: 59 kg    Exam:  . General: 84 y.o. year-old female well developed well nourished in no acute distress.  Alert and interactive.  Very hard of hearing. . Cardiovascular: Regular rate and rhythm with no rubs or gallops.  No thyromegaly or JVD noted.   Marland Kitchen Respiratory: Mild rales at bases bilaterally.  With no wheezes.  Poor  inspiratory effort. . Abdomen: Soft nontender nondistended with normal bowel sounds x4 quadrants. . Musculoskeletal: No lower extremity edema. 2/4 pulses in all 4 extremities. Marland Kitchen Psychiatry: Mood is appropriate for condition and setting   Data Reviewed: CBC: Recent Labs  Lab 12/09/17 1902 12/10/17 0526 12/11/17 0540  WBC 14.6* 13.4* 8.6  NEUTROABS 13.5*  --   --   HGB 14.0 10.8* 11.0*  HCT 39.9 32.5* 33.1*  MCV 94.1 97.0 97.9  PLT 138* 147* 149*   Basic Metabolic Panel: Recent Labs  Lab 12/09/17 1902 12/10/17 0526  NA 132* 137  K 4.3 4.6  CL 95* 102  CO2 27 28  GLUCOSE 208* 115*  BUN 14 14  CREATININE 0.62 0.76  CALCIUM 8.7* 8.1*   GFR: Estimated Creatinine Clearance: 38.3 mL/min (by C-G formula based on SCr of 0.76 mg/dL). Liver Function Tests: Recent Labs  Lab 12/09/17 1902  AST 31  ALT 16  ALKPHOS 68  BILITOT 1.3*  PROT 7.3  ALBUMIN 3.9   Recent Labs  Lab 12/09/17 1902  LIPASE 28   No results for input(s): AMMONIA in the last 168 hours. Coagulation Profile: No results for input(s): INR, PROTIME in the last 168 hours. Cardiac Enzymes: Recent Labs  Lab 12/09/17 1902  TROPONINI 0.03*   BNP (last 3 results) No results for input(s): PROBNP in the last 8760 hours. HbA1C: No results for input(s): HGBA1C in the last 72 hours. CBG: No results for input(s): GLUCAP in the last 168 hours. Lipid Profile: No results for input(s): CHOL, HDL, LDLCALC, TRIG, CHOLHDL, LDLDIRECT in the last 72 hours. Thyroid Function Tests: No results for input(s): TSH, T4TOTAL, FREET4, T3FREE, THYROIDAB in the last 72 hours. Anemia Panel: No results for input(s): VITAMINB12, FOLATE, FERRITIN, TIBC, IRON, RETICCTPCT in the last 72 hours. Urine analysis:    Component Value Date/Time   COLORURINE YELLOW 12/09/2017 1938   APPEARANCEUR CLEAR 12/09/2017 1938   LABSPEC 1.015 12/09/2017 1938   PHURINE 7.0 12/09/2017 1938   GLUCOSEU >=500 (A) 12/09/2017 1938   HGBUR TRACE (A)  12/09/2017 1938   BILIRUBINUR NEGATIVE 12/09/2017 1938   KETONESUR NEGATIVE 12/09/2017 1938   PROTEINUR NEGATIVE 12/09/2017 1938   NITRITE NEGATIVE 12/09/2017 1938   LEUKOCYTESUR NEGATIVE 12/09/2017 1938   Sepsis Labs: @LABRCNTIP (procalcitonin:4,lacticidven:4)  ) Recent Results (from the past 240 hour(s))  Culture, blood (routine x 2)     Status: None (Preliminary result)   Collection Time: 12/09/17  7:10 PM  Result Value Ref Range Status   Specimen Description   Final    BLOOD BLOOD RIGHT FOREARM Performed at [facility], [address] [place], [place] [address]    Special Requests   Final    BOTTLES DRAWN AEROBIC AND ANAEROBIC Blood Culture adequate volume Performed at [facility], [address] [place], [place] [address]    Culture   Final    NO GROWTH 2 DAYS Performed at [facility], [address], [place], [place] [address]    Report Status PENDING  Incomplete  Culture, blood (routine x 2)  Status: None (Preliminary result)   Collection Time: 12/09/17  7:20 PM  Result Value Ref Range Status   Specimen Description   Final    BLOOD BLOOD LEFT FOREARM Performed at [facility], [address] [place], [place] [address]    Special Requests   Final    BOTTLES DRAWN AEROBIC AND ANAEROBIC Blood Culture adequate volume Performed at [facility], [address] [place], [place] [address]    Culture   Final    NO GROWTH 2 DAYS Performed at [facility], [address] [place], [place] [address]    Report Status PENDING  Incomplete  Respiratory Panel by PCR     Status: None   Collection Time: 12/10/17 12:28 AM  Result Value Ref Range Status   Adenovirus NOT DETECTED NOT DETECTED Final   Coronavirus 229E NOT DETECTED NOT DETECTED Final   Coronavirus HKU1 NOT DETECTED NOT DETECTED Final   Coronavirus NL63 NOT DETECTED NOT DETECTED Final   Coronavirus OC43 NOT DETECTED NOT DETECTED Final   Metapneumovirus  NOT DETECTED NOT DETECTED Final   Rhinovirus / Enterovirus NOT DETECTED NOT DETECTED Final   Influenza A NOT DETECTED NOT DETECTED Final   Influenza B NOT DETECTED NOT DETECTED Final   Parainfluenza Virus 1 NOT DETECTED NOT DETECTED Final   Parainfluenza Virus 2 NOT DETECTED NOT DETECTED Final   Parainfluenza Virus 3 NOT DETECTED NOT DETECTED Final   Parainfluenza Virus 4 NOT DETECTED NOT DETECTED Final   Respiratory Syncytial Virus NOT DETECTED NOT DETECTED Final   Bordetella pertussis NOT DETECTED NOT DETECTED Final   Chlamydophila pneumoniae NOT DETECTED NOT DETECTED Final   Mycoplasma pneumoniae NOT DETECTED NOT DETECTED Final    Comment: Performed at [facility], [address], [place], [place] [address]      Studies: No results found.  Scheduled Meds: . amLODipine  10 mg Oral Daily  . cholecalciferol  1,000 Units Oral Daily  . enoxaparin (LOVENOX) injection  40 mg Subcutaneous Daily  . guaiFENesin  1,200 mg Oral BID  . ipratropium-albuterol  3 mL Nebulization Q6H  . metoprolol tartrate  25 mg Oral BID  . multivitamin with minerals  1 tablet Oral Daily  . sodium chloride HYPERTONIC  4 mL Nebulization TID    Continuous Infusions: . sodium chloride 500 mL (12/09/17 1953)  . sodium chloride 50 mL/hr at 12/11/17 1227  . azithromycin 500 mg (12/10/17 2211)  . cefTRIAXone (ROCEPHIN)  IV 2 g (12/10/17 2115)     LOS: 2 days     Darlin Drop, MD [facility] Pager [phone]  If 7PM-7AM, please contact night-coverage [email or web address] Password [email or web address] 12/11/2017, 1:07 PM

## 2017-12-12 LAB — PROCALCITONIN: Procalcitonin: 2.33 ng/mL

## 2017-12-12 LAB — CBC
HCT: 34.4 % — ABNORMAL LOW (ref 36.0–46.0)
Hemoglobin: 11.6 g/dL — ABNORMAL LOW (ref 12.0–15.0)
MCH: 32.6 pg (ref 26.0–34.0)
MCHC: 33.7 g/dL (ref 30.0–36.0)
MCV: 96.6 fL (ref 78.0–100.0)
PLATELETS: 187 10*3/uL (ref 150–400)
RBC: 3.56 MIL/uL — AB (ref 3.87–5.11)
RDW: 12.3 % (ref 11.5–15.5)
WBC: 7.3 10*3/uL (ref 4.0–10.5)

## 2017-12-12 LAB — LACTIC ACID, PLASMA: LACTIC ACID, VENOUS: 0.8 mmol/L (ref 0.5–1.9)

## 2017-12-12 LAB — BASIC METABOLIC PANEL
Anion gap: 9 (ref 5–15)
BUN: 6 mg/dL — ABNORMAL LOW (ref 8–23)
CALCIUM: 8.8 mg/dL — AB (ref 8.9–10.3)
CHLORIDE: 101 mmol/L (ref 98–111)
CO2: 28 mmol/L (ref 22–32)
CREATININE: 0.56 mg/dL (ref 0.44–1.00)
Glucose, Bld: 100 mg/dL — ABNORMAL HIGH (ref 70–99)
Potassium: 4.2 mmol/L (ref 3.5–5.1)
SODIUM: 138 mmol/L (ref 135–145)

## 2017-12-12 LAB — MAGNESIUM: MAGNESIUM: 1.8 mg/dL (ref 1.7–2.4)

## 2017-12-12 MED ORDER — IPRATROPIUM-ALBUTEROL 0.5-2.5 (3) MG/3ML IN SOLN: 3.0000 mL | Freq: Two times a day (BID) | RESPIRATORY_TRACT | 0 refills | Status: DC | PRN

## 2017-12-12 MED ORDER — AZITHROMYCIN 250 MG PO TABS: ORAL_TABLET | ORAL | 0 refills | Status: DC

## 2017-12-12 MED ORDER — AZITHROMYCIN 250 MG PO TABS: 500.0000 mg | ORAL_TABLET | ORAL | Status: DC

## 2017-12-12 NOTE — Discharge Instructions (Signed)

## 2017-12-12 NOTE — Progress Notes (Signed)
Pt leaving this afternoon with her daughter and her caregiver headed home. Pt lives at home with 24 hour caregivers. Discharge instructions/prescriptions given/explained with pt's daughter verbalizing understanding.  Pt left with nebulizer.

## 2017-12-12 NOTE — Care Management Important Message (Signed)
Important Message  Patient Details  Name: Anne PulseMary Ann Dengel MRN: [id number] Date of Birth: [birth year]   Medicare Important Message Given:  Yes    Elliot CousinShavis, Karee Forge Ellen, RN 12/12/2017, 11:36 AM

## 2017-12-12 NOTE — Progress Notes (Signed)
PHARMACIST - PHYSICIAN COMMUNICATION DR:   Margo AyeHall CONCERNING: Antibiotic IV to Oral Route Change Policy  RECOMMENDATION: This patient is receiving azithromycin by the intravenous route.  Based on criteria approved by the Pharmacy and Therapeutics Committee, the antibiotic(s) is/are being converted to the equivalent oral dose form(s).   DESCRIPTION: These criteria include:  Patient being treated for a respiratory tract infection, urinary tract infection, cellulitis or clostridium difficile associated diarrhea if on metronidazole  The patient is not neutropenic and does not exhibit a GI malabsorption state  The patient is eating (either orally or via tube) and/or has been taking other orally administered medications for a least 24 hours  The patient is improving clinically and has a Tmax < 100.5  If you have questions about this conversion, please contact the Pharmacy Department  []   [phone]( [phone] )  [facility] []   [phone]( [phone] )  [facility] []   [phone]( [phone] )  [facility] []   [phone]( [phone] )  [facility] [x]   [phone]( [phone] )  [facility]   Dorna LeitzAnh Stanly Si, PharmD, BCPS 12/12/2017 7:10 AM

## 2017-12-12 NOTE — Progress Notes (Addendum)
NCM spoke to pt and dtr at bedside. Pt is very HOH. Pt has private caregiver 27/7  that is paid out of pocket by family. Contacted [facility] for neb machine for home. Will deliver to room prior to dc. Pt has wheelchair, RW and bed alarm at home. Isidoro DonningAlesia Aryssa Rosamond RN CCM Case Mgmt phone [phone]

## 2017-12-12 NOTE — Discharge Summary (Addendum)
Discharge Summary  Anne Miller UJW:[id number] DOB: 01/16/1919  PCP: Wilburn Mylar, MD  Admit date: 12/09/2017 Discharge date: 12/12/2017  Time spent: 25 minutes  Recommendations for Outpatient Follow-up:  1. Follow-up with your PCP 2. Take your medication as prescribed 3. Fall precautions  Discharge Diagnoses:  Active Hospital Problems   Diagnosis Date Noted  . CAP (community acquired pneumonia) 12/09/2017  . Nausea vomiting and diarrhea 12/10/2017  . Hypoxia 12/10/2017  . Dementia 12/10/2017  . Delirium 12/09/2017    Resolved Hospital Problems  No resolved problems to display.    Discharge Condition: Stable  Diet recommendation: Resume previous diet  Vitals:   12/12/17 0501 12/12/17 0828  BP: (!) 168/71   Pulse: 66   Resp: 18   Temp: 98.9 F (37.2 C)   SpO2: 94% 96%    History of present illness:    Anne Ruark Bakeris a 84 y.o.femalewith medical history significant ofHTN, mild Alzheimer's dementia, hard of hearing. At baseline she has 24h care at home from 2 CNAs. At baseline she ambulates and likes to do puzzles.Acute onset of cough, rhinorrhea, vomiting, diarrhea.  Alternates between diarrhea and normal consistency stools for the past 2 years per her CNA. Family also noted confusion. Patient taken to UC where they gave her "a pill under her tongue" (probably a zofran ODT I suspect) no vomiting since that time and sent her to [facility] ED at [facility].Chest x-ray revealed bilateral pleural infiltrates with suspicion for community-acquired pneumonia. Elevated lactic acid on presentation, febrile with T-max 100.5, acute hypoxia, leukocytosis with WBC 14 K. Promptlystarted on IV Rocephin and IV azithromycin and admitted for CAP.  Hospital course complicated by significantly elevated procalcitonin, hypoxia, and elevated BNP.  CAP physiology improved well on IV antibiotics, round-the-clock nebs treatments and pulmonary toilet.           12/12/2017: Patient seen  and examined with her CNA at bedside.  No acute events overnight.  Per her CNA she is doing much better.  Procalcitonin continues to trend down from 5 to about 2.  Patient has no new complaints.  Denies dyspnea, palpitation or chest pain.  She passed home O2 evaluation and does not require supplemental oxygen.  On the day of discharge, the patient was hemodynamically stable, she will need to follow-up with her primary care provider post hospitalization.    Hospital Course:  Principal Problem:   CAP (community acquired pneumonia) Active Problems:   Delirium   Nausea vomiting and diarrhea   Hypoxia   Dementia  Resolved acute hypoxic respiratory failure secondary to CAP Completed 4 days of IV antibiotics Rocephin and azithromycin. Passed home O2 eval and does not require supplemental oxygen  Resolving sepsis secondary to community-acquired pneumonia Management as stated above We will continue p.o. azithromycin x4 days We will continue duo nebs twice daily as needed for wheezing Follow-up with your PCP outpatient  Grade 3 out of 6 systolic murmur with abnormal EKG Elevated BNP 403 2D echo done normal LVEF  Hypertension Initially held due to sepsis Continue amlodipine 10 mg daily and metoprolol 25 mg twice daily Follow-up with your PCP  Suspected IBS Per CNA patient's bowel movements alternate between loose stools and normal consistency stools; ongoing for at least 2 years.  She takes imodium with improvement at home.    Code Status: Partial   Consultants: None Procedures:  None     Discharge Exam: BP (!) 168/71   Pulse 66   Temp 98.9 F (37.2 C) (Oral)  Resp 18   Ht 5\' 6"  (1.676 m)   Wt 59 kg   SpO2 96%   BMI 20.98 kg/m  . General: 84 y.o. year-old female well developed well nourished in no acute distress.  Alert and interactive.  Very hard of hearing. . Cardiovascular: Regular rate and rhythm with no rubs or gallops.  No thyromegaly or JVD  noted.  Marland Kitchen. Respiratory: Clear to auscultation with no wheezes or rales. Good inspiratory effort. . Abdomen: Soft nontender nondistended with normal bowel sounds x4 quadrants. . Musculoskeletal: No lower extremity edema. 2/4 pulses in all 4 extremities. Marland Kitchen. Psychiatry: Mood is appropriate for condition and setting  Discharge Instructions You were cared for by a hospitalist during your hospital stay. If you have any questions about your discharge medications or the care you received while you were in the hospital after you are discharged, you can call the unit and asked to speak with the hospitalist on call if the hospitalist that took care of you is not available. Once you are discharged, your primary care physician will handle any further medical issues. Please note that NO REFILLS for any discharge medications will be authorized once you are discharged, as it is imperative that you return to your primary care physician (or establish a relationship with a primary care physician if you do not have one) for your aftercare needs so that they can reassess your need for medications and monitor your lab values.   Allergies as of 12/12/2017      Reactions   Ativan [lorazepam]    Caused delirium   Lactose Intolerance (gi)    Vomiting and diarrhea   Seroquel [quetiapine Fumarate]    Caused delirium   Sulfa Antibiotics    Zoloft [sertraline Hcl]    Caused delirium      Medication List    TAKE these medications   amLODipine 10 MG tablet Commonly known as:  NORVASC Take 10 mg by mouth daily.   azithromycin 250 MG tablet Commonly known as:  ZITHROMAX Take 1 tablet 250 mg daily x 4 days.   cholecalciferol 1000 units tablet Commonly known as:  VITAMIN D Take 1,000 Units by mouth daily.   ipratropium-albuterol 0.5-2.5 (3) MG/3ML Soln Commonly known as:  DUONEB Take 3 mLs by nebulization 2 (two) times daily as needed.   metoprolol tartrate 25 MG tablet Commonly known as:  LOPRESSOR Take 25 mg  by mouth 2 (two) times daily.   MULTIVITAMIN ADULT Tabs Take 1 tablet by mouth daily.            Durable Medical Equipment  (From admission, onward)         Start     Ordered   12/12/17 1041  For home use only DME Nebulizer machine  Once    Question:  Patient needs a nebulizer to treat with the following condition  Answer:  CAP (community acquired pneumonia)   12/12/17 1041         Allergies  Allergen Reactions  . Ativan [Lorazepam]     Caused delirium  . Lactose Intolerance (Gi)     Vomiting and diarrhea  . Seroquel [Quetiapine Fumarate]     Caused delirium  . Sulfa Antibiotics   . Zoloft [Sertraline Hcl]     Caused delirium   Follow-up Information    Wilburn MylarKelly, Samuel S, MD. Call in 1 day(s).   Specialty:  Family Medicine Why:  Please call for a post hospital appointment. Contact information: [address]  [place] [address] [phone]            The results of significant diagnostics from this hospitalization (including imaging, microbiology, ancillary and laboratory) are listed below for reference.    Significant Diagnostic Studies: Dg Chest 2 View  Result Date: 12/09/2017 CLINICAL DATA:  Cough x1 day EXAM: CHEST - 2 VIEW COMPARISON:  05/31/2017 FINDINGS: Stable cardiomegaly with tortuous atherosclerotic aorta. Patchy bibasilar airspace opacity suspicious for bibasilar pneumonia and atelectasis. No overt pulmonary edema. No pneumothorax. Trace pleural effusions suspected posteriorly. Degenerative changes are present about the dorsal spine. IMPRESSION: 1. Patchy bibasilar airspace opacity suspicious for pneumonia and/or atelectasis. 2. Suspect small posterior pleural effusions blunting the costophrenic angles. 3. Stable cardiomegaly with tortuous atherosclerotic aorta. Electronically Signed   By: Tollie Eth M.D.   On: 12/09/2017 19:01   Ct Head Wo Contrast  Result Date: 12/09/2017 CLINICAL DATA:  Altered mental status EXAM: CT HEAD WITHOUT  CONTRAST TECHNIQUE: Contiguous axial images were obtained from the base of the skull through the vertex without intravenous contrast. COMPARISON:  None. FINDINGS: BRAIN: There is sulcal and ventricular prominence consistent with moderate degree of superficial and central atrophy. No intraparenchymal hemorrhage, mass effect nor midline shift. Periventricular and subcortical white matter hypodensities consistent with moderate chronic appearing small vessel ischemic disease are identified. No acute large vascular territory infarcts. No abnormal extra-axial fluid collections. Basal cisterns are not effaced and midline. VASCULAR: Moderate calcific atherosclerosis of the carotid siphons. SKULL: No skull fracture. No significant scalp soft tissue swelling. SINUSES/ORBITS: The mastoid air-cells are clear. The included paranasal sinuses are well-aerated.The included ocular globes and orbital contents are non-suspicious. OTHER: None. IMPRESSION: Atrophy with moderate degree of small vessel ischemia. No acute intracranial abnormality. Electronically Signed   By: Tollie Eth M.D.   On: 12/09/2017 19:00    Microbiology: Recent Results (from the past 240 hour(s))  Culture, blood (routine x 2)     Status: None (Preliminary result)   Collection Time: 12/09/17  7:10 PM  Result Value Ref Range Status   Specimen Description   Final    BLOOD BLOOD RIGHT FOREARM Performed at [facility], [address] [place], [place] [address]    Special Requests   Final    BOTTLES DRAWN AEROBIC AND ANAEROBIC Blood Culture adequate volume Performed at [facility], [address] [place], [place] [address]    Culture   Final    NO GROWTH 3 DAYS Performed at [facility], [address], [place], [place] [address]    Report Status PENDING  Incomplete  Culture, blood (routine x 2)     Status: None (Preliminary result)   Collection Time: 12/09/17  7:20 PM  Result Value Ref Range Status   Specimen  Description   Final    BLOOD BLOOD LEFT FOREARM Performed at [facility], [address] [place], [place] [address]    Special Requests   Final    BOTTLES DRAWN AEROBIC AND ANAEROBIC Blood Culture adequate volume Performed at [facility], [address] [place], [place] [address]    Culture   Final    NO GROWTH 3 DAYS Performed at [facility], [address], [place], [place] [address]    Report Status PENDING  Incomplete  Respiratory Panel by PCR     Status: None   Collection Time: 12/10/17 12:28 AM  Result Value Ref Range Status   Adenovirus  NOT DETECTED NOT DETECTED Final   Coronavirus 229E NOT DETECTED NOT DETECTED Final   Coronavirus HKU1 NOT DETECTED NOT DETECTED Final   Coronavirus NL63 NOT DETECTED NOT DETECTED Final   Coronavirus OC43 NOT DETECTED NOT DETECTED Final   Metapneumovirus NOT DETECTED NOT DETECTED Final   Rhinovirus / Enterovirus NOT DETECTED NOT DETECTED Final   Influenza A NOT DETECTED NOT DETECTED Final   Influenza B NOT DETECTED NOT DETECTED Final   Parainfluenza Virus 1 NOT DETECTED NOT DETECTED Final   Parainfluenza Virus 2 NOT DETECTED NOT DETECTED Final   Parainfluenza Virus 3 NOT DETECTED NOT DETECTED Final   Parainfluenza Virus 4 NOT DETECTED NOT DETECTED Final   Respiratory Syncytial Virus NOT DETECTED NOT DETECTED Final   Bordetella pertussis NOT DETECTED NOT DETECTED Final   Chlamydophila pneumoniae NOT DETECTED NOT DETECTED Final   Mycoplasma pneumoniae NOT DETECTED NOT DETECTED Final    Comment: Performed at [facility], [address], [place], [place] [address]     Labs: Basic Metabolic Panel: Recent Labs  Lab 12/09/17 1902 12/10/17 0526 12/12/17 0609  NA 132* 137 138  K 4.3 4.6 4.2  CL 95* 102 101  CO2 27 28 28   GLUCOSE 208* 115* 100*  BUN 14 14 6*  CREATININE 0.62 0.76 0.56  CALCIUM 8.7* 8.1* 8.8*  MG  --   --  1.8   Liver Function Tests: Recent Labs  Lab 12/09/17 1902  AST 31    ALT 16  ALKPHOS 68  BILITOT 1.3*  PROT 7.3  ALBUMIN 3.9   Recent Labs  Lab 12/09/17 1902  LIPASE 28   No results for input(s): AMMONIA in the last 168 hours. CBC: Recent Labs  Lab 12/09/17 1902 12/10/17 0526 12/11/17 0540 12/12/17 0609  WBC 14.6* 13.4* 8.6 7.3  NEUTROABS 13.5*  --   --   --   HGB 14.0 10.8* 11.0* 11.6*  HCT 39.9 32.5* 33.1* 34.4*  MCV 94.1 97.0 97.9 96.6  PLT 138* 147* 149* 187   Cardiac Enzymes: Recent Labs  Lab 12/09/17 1902  TROPONINI 0.03*   BNP: BNP (last 3 results) Recent Labs    12/10/17 0526  BNP 403.9*    ProBNP (last 3 results) No results for input(s): PROBNP in the last 8760 hours.  CBG: No results for input(s): GLUCAP in the last 168 hours.     Signed:  Darlin Drop, MD [facility] 12/12/2017, 11:01 AM

## 2017-12-14 LAB — CULTURE, BLOOD (ROUTINE X 2)
CULTURE: NO GROWTH
Culture: NO GROWTH
SPECIAL REQUESTS: ADEQUATE
Special Requests: ADEQUATE

## 2019-06-14 ENCOUNTER — Emergency Department (HOSPITAL_BASED_OUTPATIENT_CLINIC_OR_DEPARTMENT_OTHER)
Admission: EM | Admit: 2019-06-14 | Discharge: 2019-06-14 | Disposition: A | Discharge status: Home / Self Care | Payer: Medicare Other | Attending: Emergency Medicine | Admitting: Emergency Medicine

## 2019-06-14 ENCOUNTER — Encounter (HOSPITAL_BASED_OUTPATIENT_CLINIC_OR_DEPARTMENT_OTHER): Payer: Self-pay | Admitting: *Deleted

## 2019-06-14 ENCOUNTER — Other Ambulatory Visit: Payer: Self-pay

## 2019-06-14 DIAGNOSIS — I10 Essential (primary) hypertension: Secondary | ICD-10-CM | POA: Insufficient documentation

## 2019-06-14 DIAGNOSIS — Z882 Allergy status to sulfonamides status: Secondary | ICD-10-CM | POA: Insufficient documentation

## 2019-06-14 DIAGNOSIS — N3090 Cystitis, unspecified without hematuria: Secondary | ICD-10-CM

## 2019-06-14 DIAGNOSIS — Z888 Allergy status to other drugs, medicaments and biological substances status: Secondary | ICD-10-CM | POA: Insufficient documentation

## 2019-06-14 DIAGNOSIS — E739 Lactose intolerance, unspecified: Secondary | ICD-10-CM | POA: Insufficient documentation

## 2019-06-14 DIAGNOSIS — Z79899 Other long term (current) drug therapy: Secondary | ICD-10-CM | POA: Insufficient documentation

## 2019-06-14 DIAGNOSIS — R319 Hematuria, unspecified: Secondary | ICD-10-CM | POA: Diagnosis present

## 2019-06-14 DIAGNOSIS — F0390 Unspecified dementia without behavioral disturbance: Secondary | ICD-10-CM | POA: Insufficient documentation

## 2019-06-14 DIAGNOSIS — N3001 Acute cystitis with hematuria: Secondary | ICD-10-CM | POA: Diagnosis not present

## 2019-06-14 LAB — COMPREHENSIVE METABOLIC PANEL
ALT: 15 U/L (ref 0–44)
AST: 23 U/L (ref 15–41)
Albumin: 3.8 g/dL (ref 3.5–5.0)
Alkaline Phosphatase: 66 U/L (ref 38–126)
Anion gap: 8 (ref 5–15)
BUN: 20 mg/dL (ref 8–23)
CO2: 27 mmol/L (ref 22–32)
Calcium: 9 mg/dL (ref 8.9–10.3)
Chloride: 100 mmol/L (ref 98–111)
Creatinine, Ser: 0.74 mg/dL (ref 0.44–1.00)
GFR calc Af Amer: >60 mL/min (ref >60)
GFR calc non Af Amer: >60 mL/min (ref >60)
Glucose, Bld: 104 mg/dL — ABNORMAL HIGH (ref 70–99)
Potassium: 4.4 mmol/L (ref 3.5–5.1)
Sodium: 135 mmol/L (ref 135–145)
Total Bilirubin: 0.7 mg/dL (ref 0.3–1.2)
Total Protein: 7.1 g/dL (ref 6.5–8.1)

## 2019-06-14 LAB — CBC WITH DIFFERENTIAL/PLATELET
Abs Immature Granulocytes: 0 10*3/uL (ref 0.00–0.07)
Basophils Absolute: 0.1 10*3/uL (ref 0.0–0.1)
Basophils Relative: 1 %
Eosinophils Absolute: 0.2 10*3/uL (ref 0.0–0.5)
Eosinophils Relative: 3 %
HCT: 43.6 % (ref 36.0–46.0)
Hemoglobin: 13.9 g/dL (ref 12.0–15.0)
Immature Granulocytes: 0 %
Lymphocytes Relative: 18 %
Lymphs Abs: 1.1 10*3/uL (ref 0.7–4.0)
MCH: 31.2 pg (ref 26.0–34.0)
MCHC: 31.9 g/dL (ref 30.0–36.0)
MCV: 98 fL (ref 80.0–100.0)
Monocytes Absolute: 0.7 10*3/uL (ref 0.1–1.0)
Monocytes Relative: 11 %
Neutro Abs: 4.4 10*3/uL (ref 1.7–7.7)
Neutrophils Relative %: 67 %
Platelets: 179 10*3/uL (ref 150–400)
RBC: 4.45 MIL/uL (ref 3.87–5.11)
RDW: 12.3 % (ref 11.5–15.5)
WBC: 6.4 10*3/uL (ref 4.0–10.5)
nRBC: 0 % (ref 0.0–0.2)

## 2019-06-14 LAB — URINALYSIS, ROUTINE W REFLEX MICROSCOPIC
Bilirubin Urine: NEGATIVE
Glucose, UA: NEGATIVE mg/dL
Ketones, ur: NEGATIVE mg/dL
Nitrite: POSITIVE — AB
Protein, ur: NEGATIVE mg/dL
Specific Gravity, Urine: 1.025 (ref 1.005–1.030)
pH: 5.5 (ref 5.0–8.0)

## 2019-06-14 LAB — URINALYSIS, MICROSCOPIC (REFLEX): WBC, UA: >50 WBC/hpf (ref 0–5)

## 2019-06-14 LAB — LIPASE, BLOOD: Lipase: 49 U/L (ref 11–51)

## 2019-06-14 MED ORDER — CEPHALEXIN 250 MG PO CAPS
500.0000 mg | ORAL_CAPSULE | Freq: Once | ORAL | Status: AC
  Administered 2019-06-14: 19:00:00 500 mg via ORAL
  Filled 2019-06-14: qty 2

## 2019-06-14 MED ORDER — CEPHALEXIN 500 MG PO CAPS: 500.0000 mg | ORAL_CAPSULE | Freq: Two times a day (BID) | ORAL | 0 refills | Status: AC

## 2019-06-14 MED ORDER — SODIUM CHLORIDE 0.9 % IV BOLUS
500.0000 mL | Freq: Once | INTRAVENOUS | Status: AC
  Administered 2019-06-14: 500 mL via INTRAVENOUS

## 2019-06-14 NOTE — ED Triage Notes (Signed)
She was moved to [facility] 6 weeks ago. Her son brought her to the ED today with c.o hematuria. Diarrhea that the staff feels like she may have c diff.

## 2019-06-14 NOTE — ED Provider Notes (Signed)
[facility] EMERGENCY DEPARTMENT Provider Note   CSN: [id number] Arrival date & time: 06/14/19  1610     History Chief Complaint  Patient presents with  . Hematuria    Anne Miller is a 86 y.o. female.  Level 5 caveat due to dementia.  Patient here with family.  Patient currently at nursing facility.  She has had some hematuria.  History of the same.  Per patient's son patient had cystoscopy about a year and a half ago that was unremarkable.  She has had blood in her urine for a long time, it is a chronic and intermittent issue.  Sometimes is associated with infections.  Facility was also concerned about possibly C. difficile as patient has had some diarrhea.  However no antibiotics.  No history of the same.  Possibly has had some abdominal discomfort.  No fever, appetite has been slightly decreased recently.  The history is provided by a caregiver.  Hematuria This is a recurrent problem. The current episode started 2 days ago. The problem occurs daily. The problem has not changed since onset.Nothing aggravates the symptoms. Nothing relieves the symptoms. She has tried nothing for the symptoms. The treatment provided no relief.       Past Medical History:  Diagnosis Date  . Dementia (HCC)   . Hypertension     Patient Active Problem List   Diagnosis Date Noted  . Nausea vomiting and diarrhea 12/10/2017  . Hypoxia 12/10/2017  . Dementia (HCC) 12/10/2017  . CAP (community acquired pneumonia) 12/09/2017  . Delirium 12/09/2017    History reviewed. No pertinent surgical history.   OB History   No obstetric history on file.     No family history on file.  Social History   Tobacco Use  . Smoking status: Never Smoker  . Smokeless tobacco: Never Used  Substance Use Topics  . Alcohol use: Not Currently  . Drug use: Not Currently    Home Medications Prior to Admission medications   Medication Sig Start Date End Date Taking? Authorizing Provider    amLODipine (NORVASC) 10 MG tablet Take 10 mg by mouth daily.    [provider]  azithromycin (ZITHROMAX) 250 MG tablet Take 1 tablet 250 mg daily x 4 days. 12/12/17   Darlin Drop, DO  cephALEXin (KEFLEX) 500 MG capsule Take 1 capsule (500 mg total) by mouth 2 (two) times daily for 7 days. 06/14/19 06/21/19  Christal Lagerstrom, DO  cholecalciferol (VITAMIN D) 1000 units tablet Take 1,000 Units by mouth daily.    [provider]  ipratropium-albuterol (DUONEB) 0.5-2.5 (3) MG/3ML SOLN Take 3 mLs by nebulization 2 (two) times daily as needed. 12/12/17   Darlin Drop, DO  metoprolol tartrate (LOPRESSOR) 25 MG tablet Take 25 mg by mouth 2 (two) times daily.    [provider]  Multiple Vitamins-Minerals (MULTIVITAMIN ADULT) TABS Take 1 tablet by mouth daily.    [provider]    Allergies    Ativan [lorazepam], Lactose intolerance (gi), Seroquel [quetiapine fumarate], Sulfa antibiotics, and Zoloft [sertraline hcl]  Review of Systems   Review of Systems  Unable to perform ROS: Dementia  Genitourinary: Positive for hematuria.    Physical Exam Updated Vital Signs  ED Triage Vitals  Enc Vitals Group     BP 06/14/19 1615 (!) 174/69     Pulse Rate 06/14/19 1615 (!) 57     Resp 06/14/19 1615 20     Temp 06/14/19 1615 97.7 F (36.5 C)  Temp Source 06/14/19 1615 Oral     SpO2 06/14/19 1615 99 %     Weight 06/14/19 1620 135 lb (61.2 kg)     Height 06/14/19 1620 5\' 3"  (1.6 m)     Head Circumference --      Peak Flow --      Pain Score --      Pain Loc --      Pain Edu? --      Excl. in GC? --     Physical Exam Vitals and nursing note reviewed.  Constitutional:      General: She is not in acute distress.    Appearance: She is well-developed. She is not ill-appearing.  HENT:     Head: Normocephalic and atraumatic.     Nose: Nose normal.     Mouth/Throat:     Mouth: Mucous membranes are moist.  Eyes:     Extraocular Movements: Extraocular  movements intact.     Conjunctiva/sclera: Conjunctivae normal.     Pupils: Pupils are equal, round, and reactive to light.  Cardiovascular:     Rate and Rhythm: Normal rate and regular rhythm.     Pulses: Normal pulses.     Heart sounds: Normal heart sounds. No murmur.  Pulmonary:     Effort: Pulmonary effort is normal. No respiratory distress.     Breath sounds: Normal breath sounds.  Abdominal:     General: Abdomen is flat. There is no distension.     Palpations: Abdomen is soft. There is no mass.     Tenderness: There is no abdominal tenderness. There is no guarding or rebound.     Hernia: No hernia is present.  Musculoskeletal:     Cervical back: Neck supple.  Skin:    General: Skin is warm and dry.  Neurological:     Mental Status: She is alert. Mental status is at baseline.     ED Results / Procedures / Treatments   Labs (all labs ordered are listed, but only abnormal results are displayed) Labs Reviewed  COMPREHENSIVE METABOLIC PANEL - Abnormal; Notable for the following components:      Result Value   Glucose, Bld 104 (*)    All other components within normal limits  URINALYSIS, ROUTINE W REFLEX MICROSCOPIC - Abnormal; Notable for the following components:   APPearance CLOUDY (*)    Hgb urine dipstick SMALL (*)    Nitrite POSITIVE (*)    Leukocytes,Ua MODERATE (*)    All other components within normal limits  URINALYSIS, MICROSCOPIC (REFLEX) - Abnormal; Notable for the following components:   Bacteria, UA MANY (*)    All other components within normal limits  C DIFFICILE QUICK SCREEN W PCR REFLEX  URINE CULTURE  CBC WITH DIFFERENTIAL/PLATELET  LIPASE, BLOOD    EKG None  Radiology No results found.  Procedures Procedures (including critical care time)  Medications Ordered in ED Medications  sodium chloride 0.9 % bolus 500 mL ( Intravenous Stopped 06/14/19 1737)  cephALEXin (KEFLEX) capsule 500 mg (500 mg Oral Given 06/14/19 1849)    ED Course  I  have reviewed the triage vital signs and the nursing notes.  Pertinent labs & imaging results that were available during my care of the patient were reviewed by me and considered in my medical decision making (see chart for details).    MDM Rules/Calculators/A&P                      06/16/19  Schmutz is a 86 year old female with history of dementia, hypertension who presents to the ED with hematuria.  Patient with overall unremarkable vitals.  No fever.  Patient with history of hematuria.  Has had work-up with urology in the past that was unremarkable.  Patient with dementia, family here states that she has had this for a long time.  Sometimes it is due to infection.  Family states that facility was concerned about possibly C. difficile as patient has had diarrhea.  However no recent antibiotic use.  No history of C. difficile.  No fever, benign abdominal exam.  Overall patient appears very well.  Will check basic labs, evaluate for urinary tract infection.  Does not appear to have urinary retention as abdomen is very soft and patient is calm.  We will send a stool sample for C. difficile if patient has bowel movement while here.  Otherwise stool sample can be collected at facility and sent.  Low suspicion for C. difficile at this time given history and physical.  Urinalysis consistent with infection.  Patient given Keflex.  Suspect hemorrhagic cystitis.  Has a history of the same.  No urinary retention.  Patient did not produce any stool while here.  No white count, normal vitals, normal lab work.  No significant anemia, electrolyte abnormality, kidney injury.  Overall extremely low suspicion for C. difficile.  Suspect symptoms will improve with antibiotics.  Discharged in the ED in good condition.  Family understands return precautions.  This chart was dictated using voice recognition software.  Despite best efforts to proofread,  errors can occur which can change the documentation meaning.    Final  Clinical Impression(s) / ED Diagnoses Final diagnoses:  Cystitis    Rx / DC Orders ED Discharge Orders         Ordered    cephALEXin (KEFLEX) 500 MG capsule  2 times daily     06/14/19 [place], [place], DO 06/14/19 1854

## 2019-06-14 NOTE — Discharge Instructions (Addendum)
Patient has urinary tract infection.  Please take antibiotic as prescribed.  Have very low suspicion for C. difficile as patient has no fever, no white count no abdominal pain, did not have any bowel movement while here.  Please monitor for any signs of urinary retention.  If patient develops fever worsening symptoms needs to be reevaluated as well.

## 2019-06-17 LAB — URINE CULTURE: Culture: 90000 — AB

## 2019-06-18 IMAGING — CT CT HEAD W/O CM
3 of 6 series · 13 of 47 positions shown, 15 images · non-contrast
Comparison: None.

CLINICAL DATA: Altered mental status

EXAM:
CT HEAD WITHOUT CONTRAST
TECHNIQUE: Contiguous axial images were obtained from the base of the skull
through the vertex without intravenous contrast.

[Series 2: head wo · axial · 0.41mm/px · z∈[-194,-74]mm · 8 of 32 slices shown, 10 images]
[im 4/32  brain]
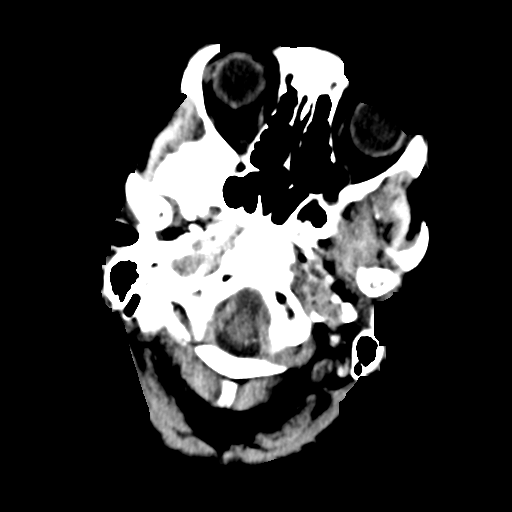
[im 4/32  bone]
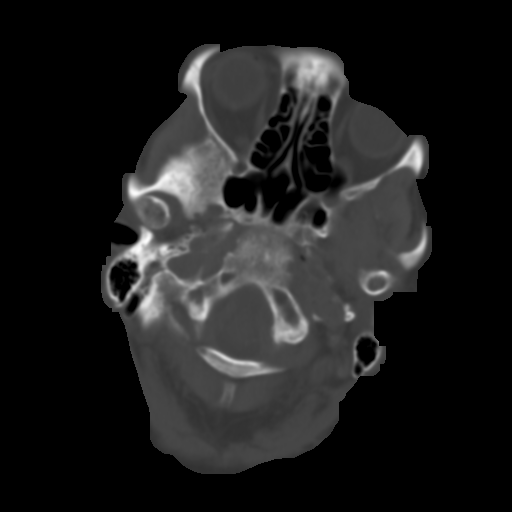
[im 7/32  brain]
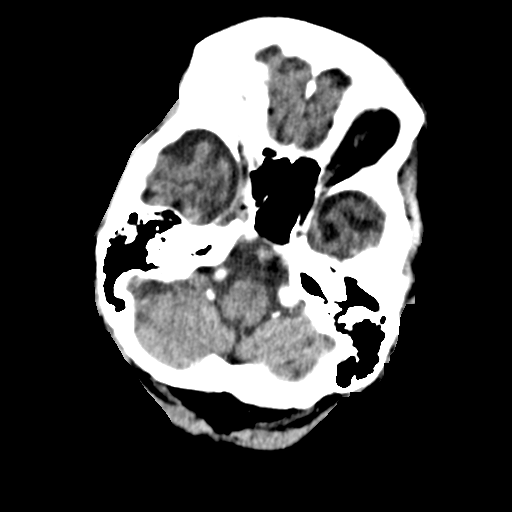
[im 10/32  brain]
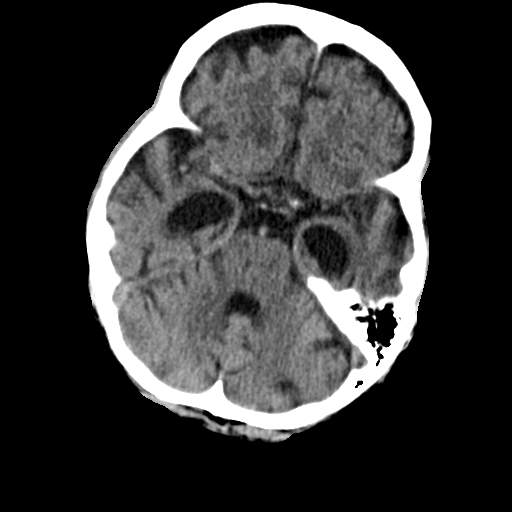
[im 14/32  brain]
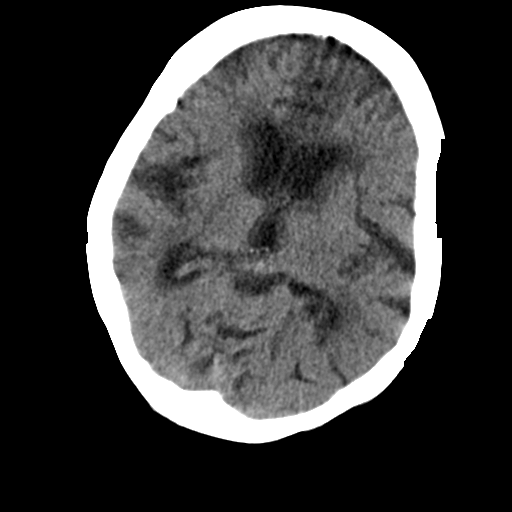
[im 18/32  brain]
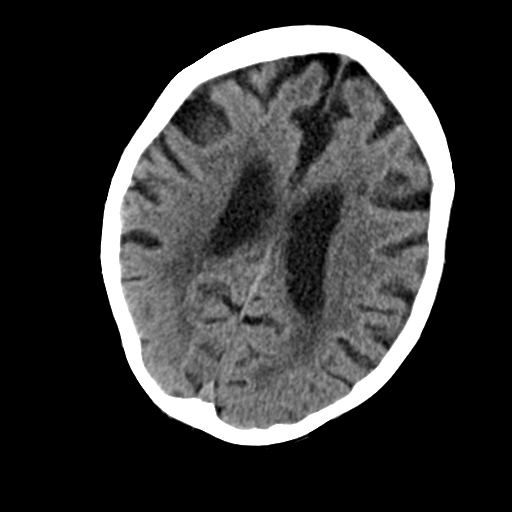
[im 18/32  bone]
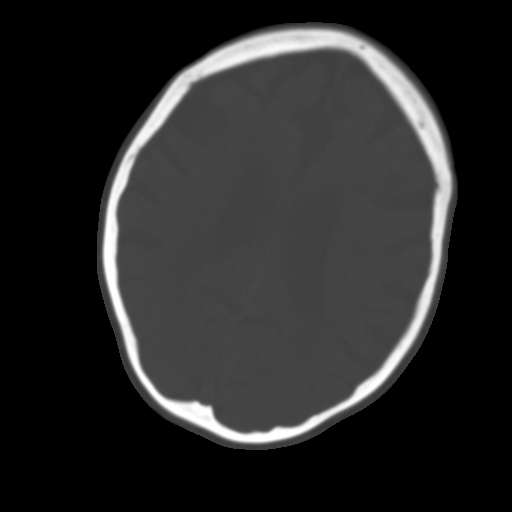
[im 22/32  brain]
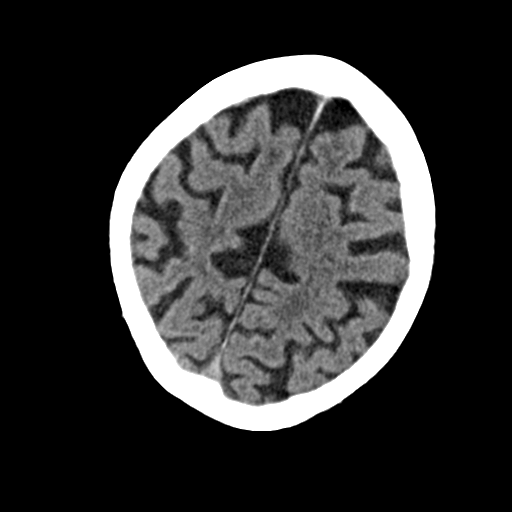
[im 25/32  brain]
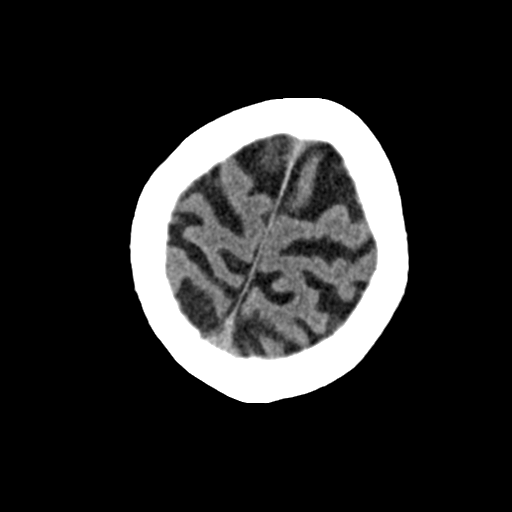
[im 28/32  brain]
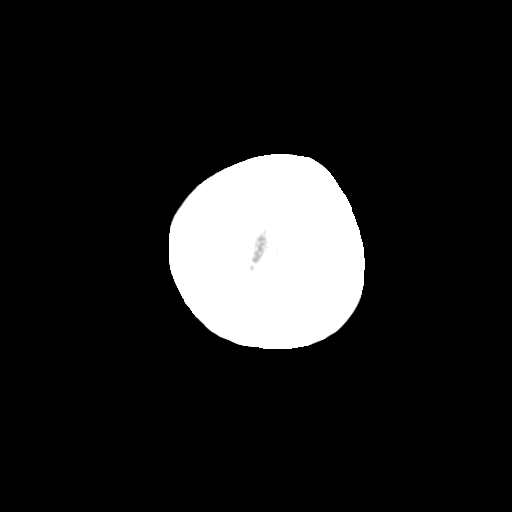

[Series 4: coronal soft · coronal · 0.30mm/px · 3 of 67 slices shown]
[im 17/67  brain]
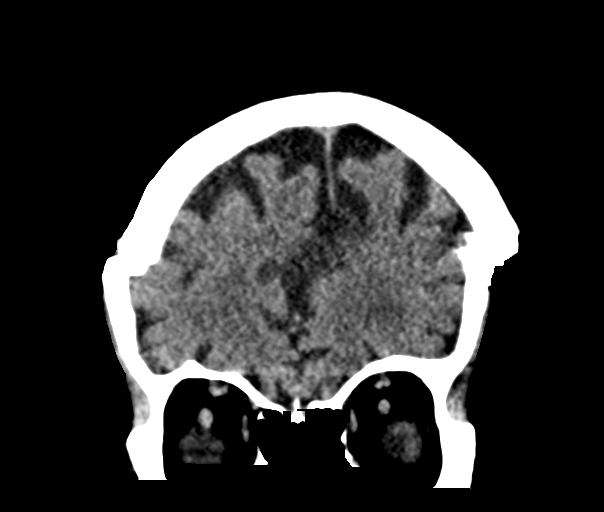
[im 34/67  brain]
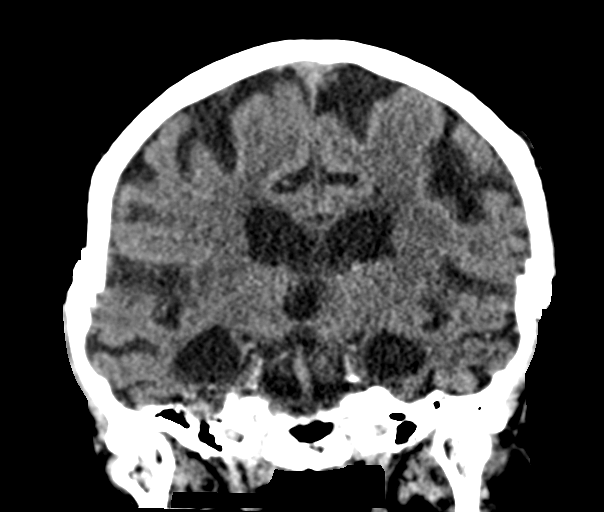
[im 50/67  brain]
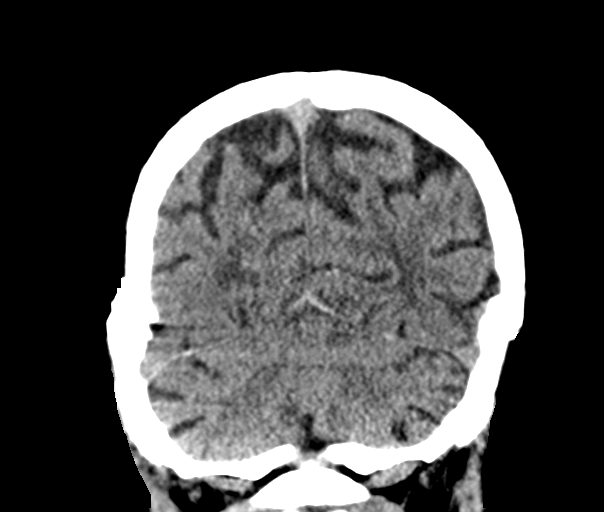

[Series 9: sag soft · sagittal · 0.16mm/px · 2 of 62 slices shown]
[im 21/62  brain]
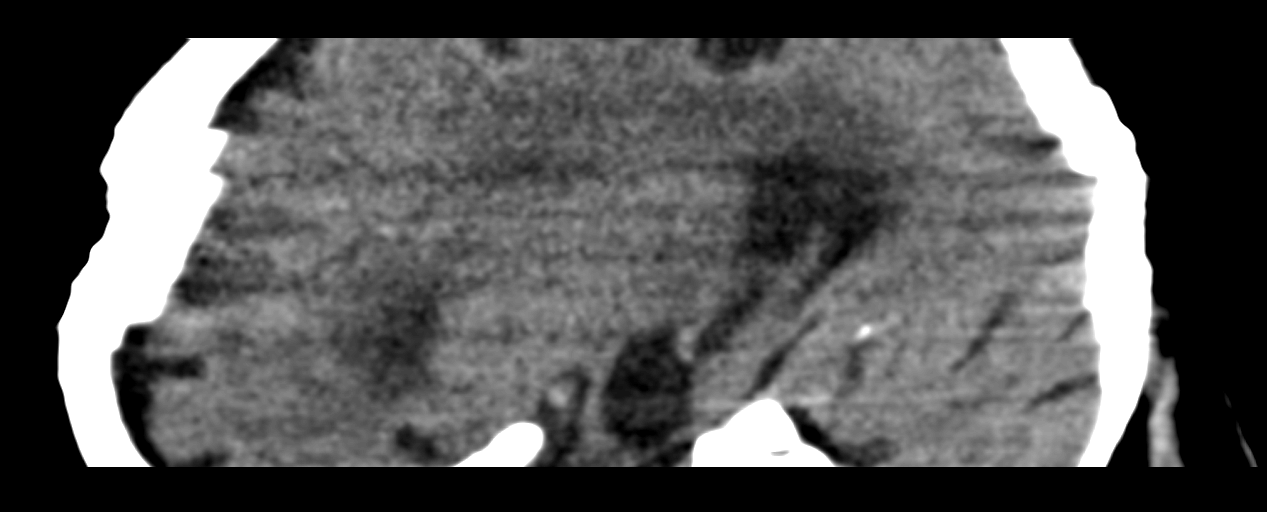
[im 41/62  brain]
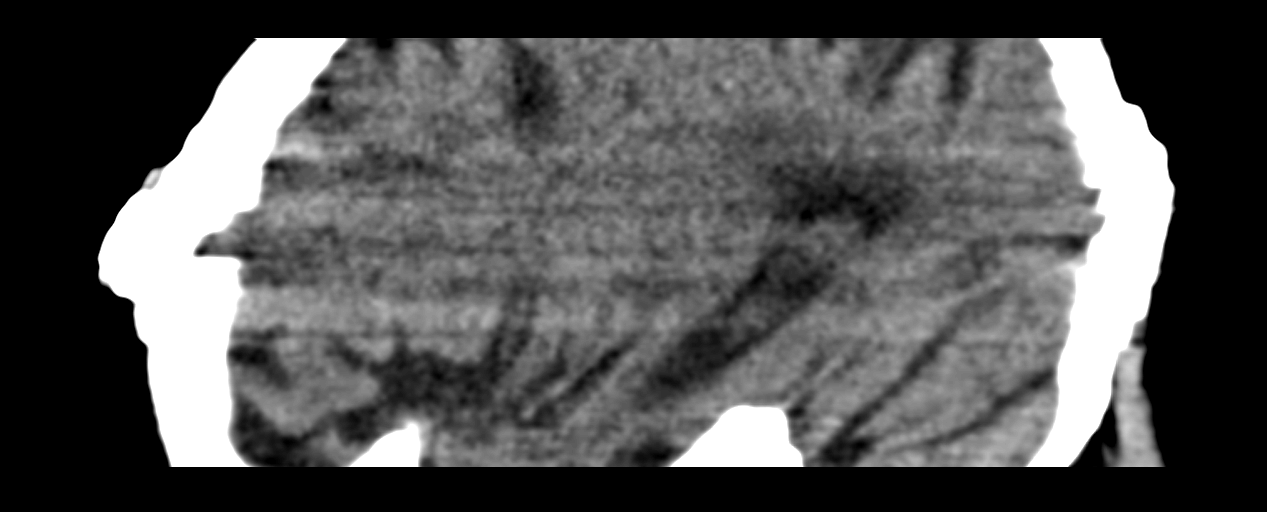

[13 of 47 positions shown; findings below may reference images not displayed]

FINDINGS: BRAIN: There is sulcal and ventricular prominence consistent with
moderate degree of superficial and central atrophy. No
intraparenchymal hemorrhage, mass effect nor midline shift.
Periventricular and subcortical white matter hypodensities
consistent with moderate chronic appearing small vessel ischemic
disease are identified. No acute large vascular territory infarcts.
No abnormal extra-axial fluid collections. Basal cisterns are not
effaced and midline.

VASCULAR: Moderate calcific atherosclerosis of the carotid siphons.

SKULL: No skull fracture. No significant scalp soft tissue swelling.

SINUSES/ORBITS: The mastoid air-cells are clear. The included
paranasal sinuses are well-aerated.The included ocular globes and
orbital contents are non-suspicious.

OTHER: None.
IMPRESSION: Atrophy with moderate degree of small vessel ischemia. No acute
intracranial abnormality.

## 2020-02-10 ENCOUNTER — Other Ambulatory Visit: Payer: Self-pay

## 2020-02-10 ENCOUNTER — Emergency Department (HOSPITAL_BASED_OUTPATIENT_CLINIC_OR_DEPARTMENT_OTHER)
Admission: EM | Admit: 2020-02-10 | Discharge: 2020-02-10 | Disposition: A | Discharge status: Home / Self Care | Payer: Medicare Other | Attending: Emergency Medicine | Admitting: Emergency Medicine

## 2020-02-10 ENCOUNTER — Encounter (HOSPITAL_BASED_OUTPATIENT_CLINIC_OR_DEPARTMENT_OTHER): Payer: Self-pay | Admitting: *Deleted

## 2020-02-10 DIAGNOSIS — Z20822 Contact with and (suspected) exposure to covid-19: Secondary | ICD-10-CM | POA: Diagnosis not present

## 2020-02-10 DIAGNOSIS — N390 Urinary tract infection, site not specified: Secondary | ICD-10-CM | POA: Insufficient documentation

## 2020-02-10 DIAGNOSIS — F05 Delirium due to known physiological condition: Secondary | ICD-10-CM | POA: Diagnosis not present

## 2020-02-10 DIAGNOSIS — I10 Essential (primary) hypertension: Secondary | ICD-10-CM | POA: Insufficient documentation

## 2020-02-10 DIAGNOSIS — Z79899 Other long term (current) drug therapy: Secondary | ICD-10-CM | POA: Insufficient documentation

## 2020-02-10 DIAGNOSIS — F0281 Dementia in other diseases classified elsewhere with behavioral disturbance: Secondary | ICD-10-CM | POA: Insufficient documentation

## 2020-02-10 LAB — RESP PANEL BY RT-PCR (FLU A&B, COVID) ARPGX2
Influenza A by PCR: NEGATIVE
Influenza B by PCR: NEGATIVE
SARS Coronavirus 2 by RT PCR: NEGATIVE

## 2020-02-10 LAB — CBC WITH DIFFERENTIAL/PLATELET
Abs Immature Granulocytes: 0.02 10*3/uL (ref 0.00–0.07)
Basophils Absolute: 0.1 10*3/uL (ref 0.0–0.1)
Basophils Relative: 1 %
Eosinophils Absolute: 0.2 10*3/uL (ref 0.0–0.5)
Eosinophils Relative: 3 %
HCT: 43.3 % (ref 36.0–46.0)
Hemoglobin: 14.4 g/dL (ref 12.0–15.0)
Immature Granulocytes: 0 %
Lymphocytes Relative: 22 %
Lymphs Abs: 1.3 10*3/uL (ref 0.7–4.0)
MCH: 32.2 pg (ref 26.0–34.0)
MCHC: 33.3 g/dL (ref 30.0–36.0)
MCV: 96.9 fL (ref 80.0–100.0)
Monocytes Absolute: 0.8 10*3/uL (ref 0.1–1.0)
Monocytes Relative: 13 %
Neutro Abs: 3.5 10*3/uL (ref 1.7–7.7)
Neutrophils Relative %: 61 %
Platelets: 174 10*3/uL (ref 150–400)
RBC: 4.47 MIL/uL (ref 3.87–5.11)
RDW: 12.5 % (ref 11.5–15.5)
WBC: 5.8 10*3/uL (ref 4.0–10.5)
nRBC: 0 % (ref 0.0–0.2)

## 2020-02-10 LAB — URINALYSIS, ROUTINE W REFLEX MICROSCOPIC
Bilirubin Urine: NEGATIVE
Glucose, UA: NEGATIVE mg/dL
Hgb urine dipstick: NEGATIVE
Ketones, ur: NEGATIVE mg/dL
Nitrite: NEGATIVE
Protein, ur: NEGATIVE mg/dL
Specific Gravity, Urine: >1.03 — ABNORMAL HIGH (ref 1.005–1.030)
pH: 5.5 (ref 5.0–8.0)

## 2020-02-10 LAB — URINALYSIS, MICROSCOPIC (REFLEX): RBC / HPF: NONE SEEN RBC/hpf (ref 0–5)

## 2020-02-10 LAB — COMPREHENSIVE METABOLIC PANEL
ALT: 16 U/L (ref 0–44)
AST: 27 U/L (ref 15–41)
Albumin: 4.1 g/dL (ref 3.5–5.0)
Alkaline Phosphatase: 69 U/L (ref 38–126)
Anion gap: 9 (ref 5–15)
BUN: 19 mg/dL (ref 8–23)
CO2: 30 mmol/L (ref 22–32)
Calcium: 9.3 mg/dL (ref 8.9–10.3)
Chloride: 100 mmol/L (ref 98–111)
Creatinine, Ser: 1.05 mg/dL — ABNORMAL HIGH (ref 0.44–1.00)
GFR, Estimated: 48 mL/min — ABNORMAL LOW (ref >60)
Glucose, Bld: 129 mg/dL — ABNORMAL HIGH (ref 70–99)
Potassium: 4.6 mmol/L (ref 3.5–5.1)
Sodium: 139 mmol/L (ref 135–145)
Total Bilirubin: 0.8 mg/dL (ref 0.3–1.2)
Total Protein: 7.5 g/dL (ref 6.5–8.1)

## 2020-02-10 NOTE — ED Notes (Signed)
Family is also concerned of her possible exposure to Covid as well

## 2020-02-10 NOTE — ED Notes (Signed)
AVS reviewed and copy provided to daughter, informed daughter per the EDP she would rec a call if abx prescribed should be changed. Opportunity for questions provided. Pt in wheelchair, assisted to POV by EMT

## 2020-02-10 NOTE — ED Notes (Signed)
Family states she had a UTI, her MD prescribed Doxycyline, family states that the long term facility was not giving her the medication. Family has noted that urine has a strong odor and also has a strong vaginal odor as well. Family states she has red area at peritoneal area as well. Family not aware of any fevers

## 2020-02-10 NOTE — Discharge Instructions (Addendum)
Urine culture should come back in a few days.  We will call if it looks like she needs to be on different antibiotics.  The Covid test should return tomorrow.

## 2020-02-10 NOTE — ED Notes (Signed)
Family states has not noted any further altered mental status or confusion, pt is [facility]

## 2020-02-10 NOTE — ED Provider Notes (Signed)
[facility] EMERGENCY DEPARTMENT Provider Note   CSN: [id number] Arrival date & time: 02/10/20  1623     History Chief Complaint  Patient presents with  . Recurrent UTI    Anne Miller is a 86 y.o. female.  HPI   Patient presents to the ED for evaluation of a couple of concerns.  Patient was residing at a nursing facility.  Family states they were concerned about the care she was receiving there.  They did not think they were doing good perineal hygiene.  Patient also has been diagnosed with a urinary tract infection and they were not sure that she was consistently getting her antibiotics.  Patient did see a doctor recently was started on doxycycline.  Family is also concerned because there was another resident who was diagnosed with Covid.  They do not feel that the staff was providing good infection control measures.  Family brought her in for evaluation.  She is not having any fevers.  No vomiting or diarrhea.  No trouble breathing.  She has had a slight cough  Past Medical History:  Diagnosis Date  . Dementia (HCC)   . Hypertension     Patient Active Problem List   Diagnosis Date Noted  . Nausea vomiting and diarrhea 12/10/2017  . Hypoxia 12/10/2017  . Dementia (HCC) 12/10/2017  . CAP (community acquired pneumonia) 12/09/2017  . Delirium 12/09/2017    History reviewed. No pertinent surgical history.   OB History   No obstetric history on file.     No family history on file.  Social History   Tobacco Use  . Smoking status: Never Smoker  . Smokeless tobacco: Never Used  Substance Use Topics  . Alcohol use: Not Currently  . Drug use: Not Currently    Home Medications Prior to Admission medications   Medication Sig Start Date End Date Taking? Authorizing Provider  amLODipine (NORVASC) 10 MG tablet Take 10 mg by mouth daily.    [provider]  azithromycin (ZITHROMAX) 250 MG tablet Take 1 tablet 250 mg daily x 4 days. 12/12/17   Darlin Drop, DO  cholecalciferol (VITAMIN D) 1000 units tablet Take 1,000 Units by mouth daily.    [provider]  ipratropium-albuterol (DUONEB) 0.5-2.5 (3) MG/3ML SOLN Take 3 mLs by nebulization 2 (two) times daily as needed. 12/12/17   Darlin Drop, DO  metoprolol tartrate (LOPRESSOR) 25 MG tablet Take 25 mg by mouth 2 (two) times daily.    [provider]  Multiple Vitamins-Minerals (MULTIVITAMIN ADULT) TABS Take 1 tablet by mouth daily.    [provider]    Allergies    Ativan [lorazepam], Lactose intolerance (gi), Seroquel [quetiapine fumarate], Sulfa antibiotics, and Zoloft [sertraline hcl]  Review of Systems   Review of Systems  All other systems reviewed and are negative.   Physical Exam Updated Vital Signs BP (!) 154/68 (BP Location: Left Arm)   Pulse (!) 58   Temp 98.5 F (36.9 C) (Oral)   Resp 20   Ht 1.575 m (5\' 2" )   SpO2 96%   BMI 24.69 kg/m   Physical Exam Vitals and nursing note reviewed.  Constitutional:      Appearance: She is well-developed. She is not ill-appearing or diaphoretic.  HENT:     Head: Normocephalic and atraumatic.     Right Ear: External ear normal.     Left Ear: External ear normal.  Eyes:     General: No scleral icterus.  Right eye: No discharge.        Left eye: No discharge.     Conjunctiva/sclera: Conjunctivae normal.  Neck:     Trachea: No tracheal deviation.  Cardiovascular:     Rate and Rhythm: Normal rate and regular rhythm.  Pulmonary:     Effort: Pulmonary effort is normal. No respiratory distress.     Breath sounds: Normal breath sounds. No stridor. No wheezing or rales.  Abdominal:     General: Bowel sounds are normal. There is no distension.     Palpations: Abdomen is soft.     Tenderness: There is no abdominal tenderness. There is no guarding or rebound.  Genitourinary:    Comments: No rash noted in genitourinary region Musculoskeletal:        General: No tenderness.     Cervical  back: Neck supple.  Skin:    General: Skin is warm and dry.     Findings: No rash.  Neurological:     Mental Status: She is alert.     Cranial Nerves: No cranial nerve deficit (no facial droop, extraocular movements intact, no slurred speech).     Sensory: No sensory deficit.     Motor: No abnormal muscle tone or seizure activity.     Coordination: Coordination normal.     ED Results / Procedures / Treatments   Labs (all labs ordered are listed, but only abnormal results are displayed) Labs Reviewed  URINALYSIS, ROUTINE W REFLEX MICROSCOPIC - Abnormal; Notable for the following components:      Result Value   APPearance HAZY (*)    Specific Gravity, Urine >1.030 (*)    Leukocytes,Ua TRACE (*)    All other components within normal limits  COMPREHENSIVE METABOLIC PANEL - Abnormal; Notable for the following components:   Glucose, Bld 129 (*)    Creatinine, Ser 1.05 (*)    GFR, Estimated 48 (*)    All other components within normal limits  URINALYSIS, MICROSCOPIC (REFLEX) - Abnormal; Notable for the following components:   Bacteria, UA RARE (*)    All other components within normal limits  RESP PANEL BY RT-PCR (FLU A&B, COVID) [id number]  URINE CULTURE  CBC WITH DIFFERENTIAL/PLATELET    EKG None  Radiology No results found.  Procedures Procedures (including critical care time)  Medications Ordered in ED Medications - No data to display  ED Course  I have reviewed the triage vital signs and the nursing notes.  Pertinent labs & imaging results that were available during my care of the patient were reviewed by me and considered in my medical decision making (see chart for details).    MDM Rules/Calculators/A&P                          Patient's laboratory tests are reassuring.  CBC and electrolyte panel unremarkable.  Urinalysis does not show definite infection.  Discussed these findings with the patient and her daughter.  We will send off a urine culture.  She is  already on antibiotics and I think we can just see what the culture and sensitivity results showed.  Daughter was concerned about possible Covid exposure.  Patient has normal oxygen saturation and normal respiratory rate.  She is not displaying any symptoms of severe Covid infection but we will send off a test.  Explained to the patient's daughter that the results should be back tomorrow.  This point she otherwise appears well.  Appears stable for discharge in the  daughter's care. Final Clinical Impression(s) / ED Diagnoses Final diagnoses:  Contact with and (suspected) exposure to covid-19    Rx / DC Orders ED Discharge Orders    None       Linwood Dibbles, MD 02/10/20 2136

## 2020-02-10 NOTE — ED Triage Notes (Signed)
C/o UTI x 1 week , not receiving meds at assisted living , family removed pt

## 2020-02-12 LAB — URINE CULTURE: Culture: <10000 — AB

## 2020-05-21 DIAGNOSIS — E119 Type 2 diabetes mellitus without complications: Secondary | ICD-10-CM

## 2020-07-10 DIAGNOSIS — H903 Sensorineural hearing loss, bilateral: Secondary | ICD-10-CM | POA: Insufficient documentation

## 2021-07-13 ENCOUNTER — Emergency Department (HOSPITAL_COMMUNITY): Payer: Medicare Other

## 2021-07-13 ENCOUNTER — Other Ambulatory Visit: Payer: Self-pay

## 2021-07-13 ENCOUNTER — Observation Stay (HOSPITAL_COMMUNITY)
Admission: EM | Admit: 2021-07-13 | Discharge: 2021-07-14 | Disposition: A | Discharge status: Skilled Nursing Facility | Payer: Medicare Other | Attending: Family Medicine | Admitting: Family Medicine

## 2021-07-13 DIAGNOSIS — R001 Bradycardia, unspecified: Secondary | ICD-10-CM | POA: Diagnosis not present

## 2021-07-13 DIAGNOSIS — I2510 Atherosclerotic heart disease of native coronary artery without angina pectoris: Secondary | ICD-10-CM | POA: Diagnosis not present

## 2021-07-13 DIAGNOSIS — I10 Essential (primary) hypertension: Secondary | ICD-10-CM | POA: Diagnosis not present

## 2021-07-13 DIAGNOSIS — E119 Type 2 diabetes mellitus without complications: Secondary | ICD-10-CM

## 2021-07-13 DIAGNOSIS — F0390 Unspecified dementia without behavioral disturbance: Secondary | ICD-10-CM | POA: Diagnosis present

## 2021-07-13 DIAGNOSIS — K219 Gastro-esophageal reflux disease without esophagitis: Secondary | ICD-10-CM

## 2021-07-13 DIAGNOSIS — Z79899 Other long term (current) drug therapy: Secondary | ICD-10-CM | POA: Diagnosis not present

## 2021-07-13 DIAGNOSIS — F03C0 Unspecified dementia, severe, without behavioral disturbance, psychotic disturbance, mood disturbance, and anxiety: Secondary | ICD-10-CM

## 2021-07-13 DIAGNOSIS — R4182 Altered mental status, unspecified: Secondary | ICD-10-CM

## 2021-07-13 DIAGNOSIS — N390 Urinary tract infection, site not specified: Secondary | ICD-10-CM

## 2021-07-13 DIAGNOSIS — R531 Weakness: Secondary | ICD-10-CM

## 2021-07-13 DIAGNOSIS — E785 Hyperlipidemia, unspecified: Secondary | ICD-10-CM

## 2021-07-13 HISTORY — DX: Atherosclerotic heart disease of native coronary artery without angina pectoris: I25.10

## 2021-07-13 HISTORY — DX: Unspecified hearing loss, unspecified ear: H91.90

## 2021-07-13 HISTORY — DX: Hyperlipidemia, unspecified: E78.5

## 2021-07-13 HISTORY — DX: Gastro-esophageal reflux disease without esophagitis: K21.9

## 2021-07-13 HISTORY — DX: Type 2 diabetes mellitus without complications: E11.9

## 2021-07-13 LAB — URINALYSIS, ROUTINE W REFLEX MICROSCOPIC
Bilirubin Urine: NEGATIVE
Glucose, UA: NEGATIVE mg/dL
Ketones, ur: NEGATIVE mg/dL
Nitrite: NEGATIVE
Protein, ur: NEGATIVE mg/dL
Specific Gravity, Urine: 1.009 (ref 1.005–1.030)
pH: 7 (ref 5.0–8.0)

## 2021-07-13 LAB — COMPREHENSIVE METABOLIC PANEL
ALT: 17 U/L (ref 0–44)
AST: 30 U/L (ref 15–41)
Albumin: 3.7 g/dL (ref 3.5–5.0)
Alkaline Phosphatase: 66 U/L (ref 38–126)
Anion gap: 8 (ref 5–15)
BUN: 20 mg/dL (ref 8–23)
CO2: 26 mmol/L (ref 22–32)
Calcium: 9.6 mg/dL (ref 8.9–10.3)
Chloride: 102 mmol/L (ref 98–111)
Creatinine, Ser: 0.96 mg/dL (ref 0.44–1.00)
GFR, Estimated: 53 mL/min — ABNORMAL LOW (ref >60)
Glucose, Bld: 134 mg/dL — ABNORMAL HIGH (ref 70–99)
Potassium: 4.7 mmol/L (ref 3.5–5.1)
Sodium: 136 mmol/L (ref 135–145)
Total Bilirubin: 1 mg/dL (ref 0.3–1.2)
Total Protein: 7.3 g/dL (ref 6.5–8.1)

## 2021-07-13 LAB — CBC WITH DIFFERENTIAL/PLATELET
Abs Immature Granulocytes: 0.01 10*3/uL (ref 0.00–0.07)
Basophils Absolute: 0.1 10*3/uL (ref 0.0–0.1)
Basophils Relative: 1 %
Eosinophils Absolute: 0.1 10*3/uL (ref 0.0–0.5)
Eosinophils Relative: 1 %
HCT: 42.8 % (ref 36.0–46.0)
Hemoglobin: 13.5 g/dL (ref 12.0–15.0)
Immature Granulocytes: 0 %
Lymphocytes Relative: 16 %
Lymphs Abs: 1 10*3/uL (ref 0.7–4.0)
MCH: 32 pg (ref 26.0–34.0)
MCHC: 31.5 g/dL (ref 30.0–36.0)
MCV: 101.4 fL — ABNORMAL HIGH (ref 80.0–100.0)
Monocytes Absolute: 0.5 10*3/uL (ref 0.1–1.0)
Monocytes Relative: 8 %
Neutro Abs: 5 10*3/uL (ref 1.7–7.7)
Neutrophils Relative %: 74 %
Platelets: 172 10*3/uL (ref 150–400)
RBC: 4.22 MIL/uL (ref 3.87–5.11)
RDW: 12.2 % (ref 11.5–15.5)
WBC: 6.7 10*3/uL (ref 4.0–10.5)
nRBC: 0 % (ref 0.0–0.2)

## 2021-07-13 LAB — TROPONIN I (HIGH SENSITIVITY)
Troponin I (High Sensitivity): 14 ng/L (ref <18)
Troponin I (High Sensitivity): 12 ng/L (ref <18)

## 2021-07-13 LAB — MAGNESIUM: Magnesium: 2 mg/dL (ref 1.7–2.4)

## 2021-07-13 MED ORDER — ACETAMINOPHEN 650 MG RE SUPP: 650.0000 mg | Freq: Four times a day (QID) | RECTAL | Status: DC | PRN

## 2021-07-13 MED ORDER — CEFTRIAXONE SODIUM 1 G IJ SOLR
1.0000 g | Freq: Once | INTRAMUSCULAR | Status: AC
Start: 2021-07-13 — End: 2021-07-14
  Administered 2021-07-13: 1 g via INTRAVENOUS
  Filled 2021-07-13: qty 10

## 2021-07-13 MED ORDER — POLYETHYLENE GLYCOL 3350 17 G PO PACK: 17.0000 g | PACK | Freq: Every day | ORAL | Status: DC | PRN

## 2021-07-13 MED ORDER — SODIUM CHLORIDE 0.9 % IV BOLUS
500.0000 mL | Freq: Once | INTRAVENOUS | Status: AC
  Administered 2021-07-13: 500 mL via INTRAVENOUS

## 2021-07-13 MED ORDER — ACETAMINOPHEN 325 MG PO TABS
650.0000 mg | ORAL_TABLET | Freq: Four times a day (QID) | ORAL | Status: DC | PRN
Start: 2021-07-13 — End: 2021-07-15

## 2021-07-13 MED ORDER — SODIUM CHLORIDE 0.9% FLUSH
3.0000 mL | Freq: Two times a day (BID) | INTRAVENOUS | Status: DC
  Administered 2021-07-13 – 2021-07-14 (×2): 3 mL via INTRAVENOUS

## 2021-07-13 MED ORDER — SODIUM CHLORIDE 0.9 % IV SOLN: INTRAVENOUS | Status: DC

## 2021-07-13 MED ORDER — ENOXAPARIN SODIUM 30 MG/0.3ML IJ SOSY
30.0000 mg | PREFILLED_SYRINGE | INTRAMUSCULAR | Status: DC
  Administered 2021-07-13: 30 mg via SUBCUTANEOUS
  Filled 2021-07-13: qty 0.3

## 2021-07-13 MED ORDER — SODIUM CHLORIDE 0.9 % IV BOLUS
500.0000 mL | Freq: Once | INTRAVENOUS | Status: AC
Start: 2021-07-13 — End: 2021-07-13
  Administered 2021-07-13: 500 mL via INTRAVENOUS

## 2021-07-13 NOTE — ED Notes (Signed)
Pt is now alert and oriented to her baseline per son. Pt able to answer questions. ?

## 2021-07-13 NOTE — ED Triage Notes (Signed)
Pt here via [facility] from [facility]. EMS called out for AMS, bradycardia and hypotension. Upon EMS arrival, pt unresponsive HR 38, BP 88/42, CBG 142, initial SpO2 76% on RA, ems placed pt on NRB at 15L, SpO2 increased to 95%. EMS began pacing pt, she was responsive to it so they  gave 5mg  versed IM.  ?

## 2021-07-13 NOTE — ED Provider Notes (Addendum)
?[facility] ?Provider Note ? ? ?CSN: [id number] ?Arrival date & time: 07/13/21  1410 ? ?  ? ?History ? ?Chief Complaint  ?Patient presents with  ? Bradycardia  ? ? ?Anne Miller is a 88 y.o. female. ? ?Patient arrives from SNF via EMS with generalized weakness and decreased responsiveness. Pt was noted to have low hr, 38, and low bp, 88/42, so was placed on external pacemaker via EMS.  Pt not verbally responsive at baseline, hx advanced dementia, not ambulatory at baseline - level 5 caveat. No report of fever. No report of  trauma or fall.  ? ?The history is provided by the patient, medical records, the EMS personnel and a relative. The history is limited by the condition of the patient.  ? ?  ? ?Home Medications ?Prior to Admission medications   ?Medication Sig Start Date End Date Taking? Authorizing Provider  ?amLODipine (NORVASC) 10 MG tablet Take 10 mg by mouth daily.    [provider]  ?azithromycin (ZITHROMAX) 250 MG tablet Take 1 tablet 250 mg daily x 4 days. 12/12/17   Kayleen Memos, DO  ?cholecalciferol (VITAMIN D) 1000 units tablet Take 1,000 Units by mouth daily.    [provider]  ?ipratropium-albuterol (DUONEB) 0.5-2.5 (3) MG/3ML SOLN Take 3 mLs by nebulization 2 (two) times daily as needed. 12/12/17   Kayleen Memos, DO  ?metoprolol tartrate (LOPRESSOR) 25 MG tablet Take 25 mg by mouth 2 (two) times daily.    [provider]  ?Multiple Vitamins-Minerals (MULTIVITAMIN ADULT) TABS Take 1 tablet by mouth daily.    [provider]  ?   ? ?Allergies    ?Ativan [lorazepam], Lactose intolerance (gi), Seroquel [quetiapine fumarate], Sulfa antibiotics, and Zoloft [sertraline hcl]   ? ?Review of Systems   ?Review of Systems  ?Unable to perform ROS: Dementia  ? ?Physical Exam ?Updated Vital Signs ?BP (!) 116/95   Pulse 61   Resp (!) 30   SpO2 98%  ?Physical Exam ?Vitals and nursing note reviewed.  ?Constitutional:   ?   Appearance:  Normal appearance. She is well-developed.  ?HENT:  ?   Head: Atraumatic.  ?   Nose: Nose normal.  ?   Mouth/Throat:  ?   Mouth: Mucous membranes are moist.  ?Eyes:  ?   General: No scleral icterus. ?   Conjunctiva/sclera: Conjunctivae normal.  ?   Pupils: Pupils are equal, round, and reactive to light.  ?Neck:  ?   Trachea: No tracheal deviation.  ?   Comments: No stiffness or rigidity.  ?Cardiovascular:  ?   Rate and Rhythm: Normal rate and regular rhythm.  ?   Pulses: Normal pulses.  ?   Heart sounds: Normal heart sounds. No murmur heard. ?  No friction rub. No gallop.  ?Pulmonary:  ?   Effort: Pulmonary effort is normal. No respiratory distress.  ?   Breath sounds: Normal breath sounds.  ?Abdominal:  ?   General: Bowel sounds are normal. There is no distension.  ?   Palpations: Abdomen is soft.  ?   Tenderness: There is no abdominal tenderness.  ?Genitourinary: ?   Comments: No cva tenderness.  ?Musculoskeletal:     ?   General: No swelling or tenderness.  ?   Cervical back: Normal range of motion and neck supple. No rigidity. No muscular tenderness.  ?Skin: ?   General: Skin is warm and dry.  ?   Findings: No rash.  ?Neurological:  ?  Mental Status: She is alert.  ?   Comments: Alert, poorly responsive. Does move bilateral extremities purposefully.   ?Psychiatric:  ?   Comments: Weak, decreased responsiveness.   ? ? ?ED Results / Procedures / Treatments   ?Labs ?(all labs ordered are listed, but only abnormal results are displayed) ?Results for orders placed or performed during the hospital encounter of 07/13/21  ?Comprehensive metabolic panel  ?Result Value Ref Range  ? Sodium 136 135 - 145 mmol/L  ? Potassium 4.7 3.5 - 5.1 mmol/L  ? Chloride 102 98 - 111 mmol/L  ? CO2 26 22 - 32 mmol/L  ? Glucose, Bld 134 (H) 70 - 99 mg/dL  ? BUN 20 8 - 23 mg/dL  ? Creatinine, Ser 0.96 0.44 - 1.00 mg/dL  ? Calcium 9.6 8.9 - 10.3 mg/dL  ? Total Protein 7.3 6.5 - 8.1 g/dL  ? Albumin 3.7 3.5 - 5.0 g/dL  ? AST 30 15 - 41 U/L  ?  ALT 17 0 - 44 U/L  ? Alkaline Phosphatase 66 38 - 126 U/L  ? Total Bilirubin 1.0 0.3 - 1.2 mg/dL  ? GFR, Estimated 53 (L) >60 mL/min  ? Anion gap 8 5 - 15  ?CBC with Differential  ?Result Value Ref Range  ? WBC 6.7 4.0 - 10.5 K/uL  ? RBC 4.22 3.87 - 5.11 MIL/uL  ? Hemoglobin 13.5 12.0 - 15.0 g/dL  ? HCT 42.8 36.0 - 46.0 %  ? MCV 101.4 (H) 80.0 - 100.0 fL  ? MCH 32.0 26.0 - 34.0 pg  ? MCHC 31.5 30.0 - 36.0 g/dL  ? RDW 12.2 11.5 - 15.5 %  ? Platelets 172 150 - 400 K/uL  ? nRBC 0.0 0.0 - 0.2 %  ? Neutrophils Relative % 74 %  ? Neutro Abs 5.0 1.7 - 7.7 K/uL  ? Lymphocytes Relative 16 %  ? Lymphs Abs 1.0 0.7 - 4.0 K/uL  ? Monocytes Relative 8 %  ? Monocytes Absolute 0.5 0.1 - 1.0 K/uL  ? Eosinophils Relative 1 %  ? Eosinophils Absolute 0.1 0.0 - 0.5 K/uL  ? Basophils Relative 1 %  ? Basophils Absolute 0.1 0.0 - 0.1 K/uL  ? Immature Granulocytes 0 %  ? Abs Immature Granulocytes 0.01 0.00 - 0.07 K/uL  ?Urinalysis, Routine w reflex microscopic Urine, Clean Catch  ?Result Value Ref Range  ? Color, Urine YELLOW YELLOW  ? APPearance HAZY (A) CLEAR  ? Specific Gravity, Urine 1.009 1.005 - 1.030  ? pH 7.0 5.0 - 8.0  ? Glucose, UA NEGATIVE NEGATIVE mg/dL  ? Hgb urine dipstick SMALL (A) NEGATIVE  ? Bilirubin Urine NEGATIVE NEGATIVE  ? Ketones, ur NEGATIVE NEGATIVE mg/dL  ? Protein, ur NEGATIVE NEGATIVE mg/dL  ? Nitrite NEGATIVE NEGATIVE  ? Leukocytes,Ua MODERATE (A) NEGATIVE  ? RBC / HPF 0-5 0 - 5 RBC/hpf  ? WBC, UA 21-50 0 - 5 WBC/hpf  ? Bacteria, UA RARE (A) NONE SEEN  ? Mucus PRESENT   ?Troponin I (High Sensitivity)  ?Result Value Ref Range  ? Troponin I (High Sensitivity) 14 <18 ng/L  ?Troponin I (High Sensitivity)  ?Result Value Ref Range  ? Troponin I (High Sensitivity) 12 <18 ng/L  ? ? ?EKG ?EKG Interpretation ? ?Date/Time:  Saturday July 13 2021 14:37:50 EDT ?Ventricular Rate:  61 ?PR Interval:  193 ?QRS Duration: 157 ?QT Interval:  476 ?QTC Calculation: 480 ?R Axis:   -41 ?Text Interpretation: Sinus rhythm RBBB and LAFB  Probable left ventricular hypertrophy Non-specific  ST-t changes Confirmed by Lajean Saver [phone]) on 07/13/2021 2:44:27 PM ? ?Radiology ?DG Chest Portable 1 View ? ?Result Date: 07/13/2021 ?CLINICAL DATA:  Altered mental status.  Bradycardia and hypotension. EXAM: PORTABLE CHEST 1 VIEW COMPARISON:  Chest two views 12/09/2017, 05/31/2017 FINDINGS: Cardiac silhouette is again moderately enlarged. Mildly tortuous thoracic aorta is again seen. Mild calcification within the aortic arch. A transcutaneous cardiac pacing pad overlies the left lower hemithorax. Mild chronic interstitial thickening, similar to prior. Improved aeration of the right lung base. Likely small left pleural effusion with associated left basilar opacification, similar to 12/09/2017. No pneumothorax. Mild dextrocurvature of the mid to upper thoracic spine and mild levocurvature of the lumbar spine. There appears to be high-grade height loss of a lower thoracic vertebral body appearing unchanged from 12/09/2017 and 05/31/2017 radiographs. IMPRESSION: Improved aeration of the right lung base with mild left basilar opacity that is similar to 12/09/2017 but likely increased from 05/31/2017. This may represent a small left pleural effusion with associated atelectasis versus pneumonia. Electronically Signed   By: Yvonne Kendall M.D.   On: 07/13/2021 15:01   ? ?Procedures ?Procedures  ? ? ?Medications Ordered in ED ?Medications  ?sodium chloride 0.9 % bolus 500 mL (has no administration in time range)  ? ? ?ED Course/ Medical Decision Making/ A&P ?  ?                        ?Medical Decision Making ?Problems Addressed: ?Acute alteration in mental status: acute illness or injury with systemic symptoms that poses a threat to life or bodily functions ?Acute UTI: acute illness or injury with systemic symptoms that poses a threat to life or bodily functions ?General weakness: acute illness or injury with systemic symptoms that poses a threat to life or bodily  functions ?Symptomatic bradycardia: acute illness or injury with systemic symptoms that poses a threat to life or bodily functions ? ?Amount and/or Complexity of Data Reviewed ?Independent Historian: EMS ?   Tennis Must

## 2021-07-13 NOTE — H&P (Signed)
?History and Physical  ? ?Anne Miller OEV:[id number] DOB: Apr 07, 1918 DOA: 07/13/2021 ? ?PCP: Pcp, No  ? ?Patient coming from: [facility]  ? ?Chief Complaint: AMS, Weakness ? ?HPI: Anne Miller is a 88 y.o. female with medical history significant of dementia, hypertension, GERD, hyperlipidemia, CAD, hearing loss, diabetes presenting for skilled nursing facility with altered mentation. ? ?History obtained with assistance of family and chart review due to patient's altered mentation and baseline dementia.  Patient is reportedly not verbal nor ambulatory at baseline.  She presents from her SNF after staff noted she seemed weak with decreased alertness and decreased p.o. intake.  EMS was called and noted to have heart rate as low as 38 blood pressure in the 80s over 40s and hypoxia.  Patient placed on supplemental oxygen and an external pacemaker in route. ? ?EDP discussed with family and confirmed patient is DNR/DNI.  Also would not be in her wishes/family's wishes for surgical intervention including pacemaker placement.  With this understanding family and ED provider decided to discontinue external pacemaker.  We discussed what to do if her heart rate were to drop to significant and symptomatic bradycardia again.  He stated he would want to be contacted but would not want her placed back on a pacemaker.  His focus would be on keeping her comfortable at that point. ? ?Heart rate remained reasonable in the 50s to 60s in the ED. Patient has returned to baseline per son. Unable to obtain review of systems due to patient's altered mentation and baseline dementia. ? ?ED Course: Vital signs in the ED significant for heart rate in the 50s to 60s as above.  Blood pressure in the 100s to 140s systolic.  Initially requiring oxygen supplementation.  Lab work-up included CMP with glucose 134.  CBC within normal limits.  Troponin negative x2.  Urinalysis with hemoglobin, leukocytes, bacteria.  Chest x-ray with improved  aeration of the right base from previous also with left basilar opacity similar to 2019 possibility of pleural effusion with atelectasis versus pneumonia.  Patient received a liter of fluids in the ED and a dose of ceftriaxone has been ordered. ? ?Review of Systems: Unable to obtain review of systems due to patient's altered mentation and baseline dementia. ? ?Past Medical History:  ?Diagnosis Date  ? Dementia (HCC)   ? Hypertension   ? ? ?No past surgical history on file. ? ?Social History ? reports that she has never smoked. She has never used smokeless tobacco. She reports that she does not currently use alcohol. She reports that she does not currently use drugs. ? ?Allergies  ?Allergen Reactions  ? Ativan [Lorazepam]   ?  Caused delirium  ? Lactose Intolerance (Gi)   ?  Vomiting and diarrhea  ? Seroquel [Quetiapine Fumarate]   ?  Caused delirium  ? Sulfa Antibiotics   ? Zoloft [Sertraline Hcl]   ?  Caused delirium  ? ?No family history on file. ? ?Prior to Admission medications   ?Medication Sig Start Date End Date Taking? Authorizing Provider  ?amLODipine (NORVASC) 10 MG tablet Take 10 mg by mouth daily.    [provider]  ?cholecalciferol (VITAMIN D) 1000 units tablet Take 1,000 Units by mouth daily.    [provider]  ?ipratropium-albuterol (DUONEB) 0.5-2.5 (3) MG/3ML SOLN Take 3 mLs by nebulization 2 (two) times daily as needed. 12/12/17   Darlin Drop, DO  ?metoprolol tartrate (LOPRESSOR) 25 MG tablet Take 25 mg by mouth 2 (two) times  daily.    [provider]  ?Multiple Vitamins-Minerals (MULTIVITAMIN ADULT) TABS Take 1 tablet by mouth daily.    [provider]  ? ? ?Physical Exam: ?Vitals:  ? 07/13/21 1745 07/13/21 1800 07/13/21 1815 07/13/21 1830  ?BP: (!) 147/84 (!) 156/78 (!) 148/86 (!) 146/74  ?Pulse: (!) 56 (!) 56 (!) 59 (!) 51  ?Resp: 15 15 17 13   ?Temp:      ?TempSrc:      ?SpO2: 100% 100% 100% 100%  ? ? ?Physical Exam ?Constitutional:   ?   General: She is  not in acute distress. ?   Appearance: Normal appearance.  ?HENT:  ?   Head: Normocephalic and atraumatic.  ?   Mouth/Throat:  ?   Mouth: Mucous membranes are moist.  ?   Pharynx: Oropharynx is clear.  ?Eyes:  ?   Extraocular Movements: Extraocular movements intact.  ?   Pupils: Pupils are equal, round, and reactive to light.  ?Cardiovascular:  ?   Rate and Rhythm: Normal rate and regular rhythm.  ?   Pulses: Normal pulses.  ?   Heart sounds: Normal heart sounds.  ?Pulmonary:  ?   Effort: Pulmonary effort is normal. No respiratory distress.  ?   Breath sounds: Normal breath sounds.  ?Abdominal:  ?   General: Bowel sounds are normal. There is no distension.  ?   Palpations: Abdomen is soft.  ?   Tenderness: There is no abdominal tenderness.  ?Musculoskeletal:     ?   General: No swelling or deformity.  ?Skin: ?   General: Skin is warm and dry.  ?Neurological:  ?   General: No focal deficit present.  ?   Mental Status: Mental status is at baseline.  ? ? ?Labs on Admission: I have personally reviewed following labs and imaging studies ? ?CBC: ?Recent Labs  ?Lab 07/13/21 ?1435  ?WBC 6.7  ?NEUTROABS 5.0  ?HGB 13.5  ?HCT 42.8  ?MCV 101.4*  ?PLT 172  ? ? ?Basic Metabolic Panel: ?Recent Labs  ?Lab 07/13/21 ?1435  ?NA 136  ?K 4.7  ?CL 102  ?CO2 26  ?GLUCOSE 134*  ?BUN 20  ?CREATININE 0.96  ?CALCIUM 9.6  ? ? ?GFR: ?CrCl cannot be calculated (Unknown ideal weight.). ? ?Liver Function Tests: ?Recent Labs  ?Lab 07/13/21 ?1435  ?AST 30  ?ALT 17  ?ALKPHOS 66  ?BILITOT 1.0  ?PROT 7.3  ?ALBUMIN 3.7  ? ? ?Urine analysis: ?   ?Component Value Date/Time  ? COLORURINE YELLOW 07/13/2021 1440  ? APPEARANCEUR HAZY (A) 07/13/2021 1440  ? LABSPEC 1.009 07/13/2021 1440  ? PHURINE 7.0 07/13/2021 1440  ? GLUCOSEU NEGATIVE 07/13/2021 1440  ? HGBUR SMALL (A) 07/13/2021 1440  ? BILIRUBINUR NEGATIVE 07/13/2021 1440  ? KETONESUR NEGATIVE 07/13/2021 1440  ? PROTEINUR NEGATIVE 07/13/2021 1440  ? NITRITE NEGATIVE 07/13/2021 1440  ? LEUKOCYTESUR  MODERATE (A) 07/13/2021 1440  ? ? ?Radiological Exams on Admission: ?DG Chest Portable 1 View ? ?Result Date: 07/13/2021 ?CLINICAL DATA:  Altered mental status.  Bradycardia and hypotension. EXAM: PORTABLE CHEST 1 VIEW COMPARISON:  Chest two views 12/09/2017, 05/31/2017 FINDINGS: Cardiac silhouette is again moderately enlarged. Mildly tortuous thoracic aorta is again seen. Mild calcification within the aortic arch. A transcutaneous cardiac pacing pad overlies the left lower hemithorax. Mild chronic interstitial thickening, similar to prior. Improved aeration of the right lung base. Likely small left pleural effusion with associated left basilar opacification, similar to 12/09/2017. No pneumothorax. Mild dextrocurvature of the mid to upper  thoracic spine and mild levocurvature of the lumbar spine. There appears to be high-grade height loss of a lower thoracic vertebral body appearing unchanged from 12/09/2017 and 05/31/2017 radiographs. IMPRESSION: Improved aeration of the right lung base with mild left basilar opacity that is similar to 12/09/2017 but likely increased from 05/31/2017. This may represent a small left pleural effusion with associated atelectasis versus pneumonia. Electronically Signed   By: Neita Garnetonald  Viola M.D.   On: 07/13/2021 15:01   ? ?EKG: Independently reviewed.  Sinus rhythm at 61 bpm.  Right bundle branch block and read as left anterior fascicular block.  Evidence of LVH.  Nonspecific ST-T wave changes.  Similar to previous in 2019. ? ?Assessment/Plan ?Principal Problem: ?  Symptomatic bradycardia ?Active Problems: ?  Dementia (HCC) ?  Esophageal reflux ?  Hyperlipidemia with target LDL less than 100 ?  Atherosclerotic heart disease of native coronary artery without angina pectoris ?  Type 2 diabetes mellitus without complication (HCC) ? ?Symptomatic bradycardia ?> Patient presenting from nursing facility with evidence of symptomatic bradycardia with heart rate initially in the 30s and blood  pressure in the 80s systolic with hypoxia and altered mentation..  Placed on external pacemaker in route.  This was discontinued in the ED after patient's wishes were clarified as she is DNR/DNI and would not want su

## 2021-07-13 NOTE — ED Notes (Signed)
Pt placed on bair hugger

## 2021-07-13 NOTE — ED Notes (Signed)
RN removed pt from bair hugger ?

## 2021-07-14 ENCOUNTER — Encounter (HOSPITAL_COMMUNITY): Payer: Self-pay | Admitting: Internal Medicine

## 2021-07-14 DIAGNOSIS — E119 Type 2 diabetes mellitus without complications: Secondary | ICD-10-CM | POA: Diagnosis not present

## 2021-07-14 DIAGNOSIS — F03C0 Unspecified dementia, severe, without behavioral disturbance, psychotic disturbance, mood disturbance, and anxiety: Secondary | ICD-10-CM | POA: Diagnosis not present

## 2021-07-14 DIAGNOSIS — R001 Bradycardia, unspecified: Secondary | ICD-10-CM | POA: Diagnosis not present

## 2021-07-14 DIAGNOSIS — I2510 Atherosclerotic heart disease of native coronary artery without angina pectoris: Secondary | ICD-10-CM | POA: Diagnosis not present

## 2021-07-14 LAB — COMPREHENSIVE METABOLIC PANEL
ALT: 16 U/L (ref 0–44)
AST: 22 U/L (ref 15–41)
Albumin: 3.1 g/dL — ABNORMAL LOW (ref 3.5–5.0)
Alkaline Phosphatase: 53 U/L (ref 38–126)
Anion gap: 8 (ref 5–15)
BUN: 16 mg/dL (ref 8–23)
CO2: 24 mmol/L (ref 22–32)
Calcium: 8.9 mg/dL (ref 8.9–10.3)
Chloride: 107 mmol/L (ref 98–111)
Creatinine, Ser: 0.62 mg/dL (ref 0.44–1.00)
GFR, Estimated: >60 mL/min (ref >60)
Glucose, Bld: 89 mg/dL (ref 70–99)
Potassium: 4.1 mmol/L (ref 3.5–5.1)
Sodium: 139 mmol/L (ref 135–145)
Total Bilirubin: 0.9 mg/dL (ref 0.3–1.2)
Total Protein: 5.8 g/dL — ABNORMAL LOW (ref 6.5–8.1)

## 2021-07-14 LAB — CBC
HCT: 34.6 % — ABNORMAL LOW (ref 36.0–46.0)
Hemoglobin: 11.2 g/dL — ABNORMAL LOW (ref 12.0–15.0)
MCH: 32.5 pg (ref 26.0–34.0)
MCHC: 32.4 g/dL (ref 30.0–36.0)
MCV: 100.3 fL — ABNORMAL HIGH (ref 80.0–100.0)
Platelets: 151 10*3/uL (ref 150–400)
RBC: 3.45 MIL/uL — ABNORMAL LOW (ref 3.87–5.11)
RDW: 12.3 % (ref 11.5–15.5)
WBC: 7.4 10*3/uL (ref 4.0–10.5)
nRBC: 0 % (ref 0.0–0.2)

## 2021-07-14 LAB — MRSA NEXT GEN BY PCR, NASAL: MRSA by PCR Next Gen: NOT DETECTED

## 2021-07-14 MED ORDER — SODIUM CHLORIDE 0.9 % IV SOLN: 1.0000 g | INTRAVENOUS | Status: DC

## 2021-07-14 MED ORDER — CEFDINIR 300 MG PO CAPS: 300.0000 mg | ORAL_CAPSULE | Freq: Two times a day (BID) | ORAL | 0 refills | Status: AC

## 2021-07-14 MED ORDER — AMLODIPINE BESYLATE 2.5 MG PO TABS
2.5000 mg | ORAL_TABLET | Freq: Every day | ORAL | Status: DC
  Administered 2021-07-14: 2.5 mg via ORAL
  Filled 2021-07-14: qty 1

## 2021-07-14 NOTE — Social Work (Addendum)
CSW attempted to reach pt's son Anne Miller in regards to pt DC needs. CSW had to leave message. ? ?CSW attempted to call Anne Miller at [facility] twice and had to leave message. CSW will continue to attempt to reach facility. ? ?12:00PM- CSW confirmed with Son plan for pt to return to [facility].Spoke with son about hospice services and son declined with moving forward at this time. CSW spoke with Anne Miller and she stated that pt can return today with a DC summary.CSW updated MD about son not moving forward with hospice at this time. ? ?TOC team will continue to assist with discharge planning needs.  ? ? ?

## 2021-07-14 NOTE — TOC Transition Note (Signed)
Transition of Care (TOC) - CM/SW Discharge Note ? ? ?Patient Details  ?Name: Anne Miller ?MRN: [id number] ?Date of Birth: 15-Apr-1918 ? ?Transition of Care (TOC) CM/SW Contact:  ?Bary Castilla, LCSW ?Phone Number:[phone] ?07/14/2021, 1:04 PM ? ? ?Clinical Narrative:    ? ?Patient will DC to:?[facility] ?Anticipated DC date:?07/14/2021 ?Family notified:?John ?Transport [id number] ?  ?Per MD patient ready for DC to [facility]. RN, patient, patient's family, and facility notified of DC. Discharge Summary sent to facility. RN given number for report  [id number] room 402b. DC packet on chart. Ambulance transport requested for patient.  ? ?CSW signing off. ?  ?Vallery Ridge, LCSW ?[phone]  ? ?  ?  ? ? ?Patient Goals and CMS Choice ?  ?  ?  ? ?Discharge Placement ?  ?           ?  ?  ?  ?  ? ?Discharge Plan and Services ?  ?  ?           ?  ?  ?  ?  ?  ?  ?  ?  ?  ?  ? ?Social Determinants of Health (SDOH) Interventions ?  ? ? ?Readmission Risk Interventions ?   ? View : No data to display.  ?  ?  ?  ? ? ? ? ? ?

## 2021-07-14 NOTE — Plan of Care (Signed)

## 2021-07-14 NOTE — Discharge Summary (Addendum)
?Physician Discharge Summary ?  ?Patient: Anne Miller MRN: [id number] DOB: 1918/10/11  ?Admit date:     07/13/2021  ?Discharge date: 07/14/21  ?Discharge Physician: Alberteen SamChristopher P Jenness Stemler  ? ?PCP: Pcp, No  ? ? ? ?Recommendations at discharge:  ?Eligha BridegroomShannon Gray: Please cut patient's food and assist with feeding all meals ?Eligha BridegroomShannon Gray: Please give cefdinir 300 mg twice daily for 4 more days, then resume prior cephalexin 250 mg daily ?Eligha BridegroomShannon Gray: Family prefer no further hospitalizations ?Eligha BridegroomShannon Gray: Avoid all heart rate lowering medications ? ? ? ? ?Discharge Diagnoses: ?Principal Problem: ?  Symptomatic bradycardia ?Active Problems: ?  Dementia (HCC) ?  Esophageal reflux ?  Hyperlipidemia with target LDL less than 100 ?  Atherosclerotic heart disease of native coronary artery without angina pectoris ?  Type 2 diabetes mellitus without complication (HCC) ? ?   ? ? ? ?Anne Miller is a [age] y.o. F with dementia, HTN, CAD, deafness who presented with decreased mentation. ? ?EMS found the patient to have bradycardia, started external pacing and transported to the ER. ? ?In the ER, she was in sinus bradycardia, rates 40s.  Electrolytes and renal function normal.  UA with pyuria and bacteriuria, CXR with atelectasis. ? ?  ? ? ? ? ?Bradycardia ?No overt electrolyte abnormality or infection.   ? ?Suspect that recent low oral intake precipitated hypotension and bradycardia, as she improved with fluids. ? ?Discussed with family, aggressive work up for cause of bradycardia, work up for ischemia, and work up for pacemaker would not improve patient's quality of life. ? ?They confirmed DNR status and would like reasonable comfort measures but no further escalation of care including no further hospitalizations. ? ? ? ? ? ?Possible Community acquired pneumonia ?Patient given fluids and Rocephin and improved.  Given the appearance of her chest x-ray, pneumonia could not be ruled out, and so given her improvement with Rocephin,  recommend complete 5 days. ?- Complete cefdinir 300 bid 5 days ?- Then resume baseline cephalexin 250 mg for UTI ppx ? ? ? ? ? ? ?  ? ? ? ? ? ?[facility] [place] was reviewed for this patient prior to discharge.  ? ? ?  ?Disposition: Skilled nursing facility ?Diet recommendation:  ?Discharge Diet Orders (From admission, onward)  ? ?  Start     Ordered  ? 07/14/21 0000  Diet - low sodium heart healthy       ? 07/14/21 1250  ? ?  ?  ? ?  ? ? ? ?DISCHARGE MEDICATION: ?Allergies as of 07/14/2021   ? ?   Reactions  ? Ativan [lorazepam]   ? Caused delirium  ? Lactose Intolerance (gi)   ? Vomiting and diarrhea  ? Seroquel [quetiapine Fumarate]   ? Caused delirium  ? Sulfa Antibiotics   ? Zoloft [sertraline Hcl]   ? Caused delirium  ? ?  ? ?  ?Medication List  ?  ? ?STOP taking these medications   ? ?cephALEXin 250 MG capsule ?Commonly known as: KEFLEX ?  ?ipratropium-albuterol 0.5-2.5 (3) MG/3ML Soln ?Commonly known as: DUONEB ?  ?metoprolol tartrate 25 MG tablet ?Commonly known as: LOPRESSOR ?  ? ?  ? ?TAKE these medications   ? ?amLODipine 2.5 MG tablet ?Commonly known as: NORVASC ?Take 2.5 mg by mouth daily. ?  ?cefdinir 300 MG capsule ?Commonly known as: OMNICEF ?Take 1 capsule (300 mg total) by mouth 2 (two) times daily for 4 days. ?  ?cholecalciferol 1000 units tablet ?  Commonly known as: VITAMIN D ?Take 1,000 Units by mouth daily. ?  ?Multivitamin Adult Tabs ?Take 1 tablet by mouth daily. ?  ? ?  ? ? ? ?Discharge Instructions   ? ? Diet - low sodium heart healthy   Complete by: As directed ?  ? No wound care   Complete by: As directed ?  ? ?  ? ? ?Discharge Exam: ?Filed Weights  ? 07/13/21 2100 07/14/21 0524  ?Weight: 48.1 kg 48.2 kg  ? ? ?General: Pt is alert, awake, not in acute distress, deaf, mostly speaks incoherently ?Cardiovascular: RRR, nl S1-S2, no murmurs appreciated.   No LE edema.   ?Respiratory: Normal respiratory rate and rhythm.  CTAB without rales or wheezes. ?Abdominal:  Abdomen soft and non-tender.  No distension or HSM.   ?Neuro/Psych: Strength symmetric in upper and lower extremities , able to transfer to chair without difficulty with assistance.  Judgment and insight appear at baseline. ? ? ?Condition at discharge: good ? ?The results of significant diagnostics from this hospitalization (including imaging, microbiology, ancillary and laboratory) are listed below for reference.  ? ?Imaging Studies: ?DG Chest Portable 1 View ? ?Result Date: 07/13/2021 ?CLINICAL DATA:  Altered mental status.  Bradycardia and hypotension. EXAM: PORTABLE CHEST 1 VIEW COMPARISON:  Chest two views 12/09/2017, 05/31/2017 FINDINGS: Cardiac silhouette is again moderately enlarged. Mildly tortuous thoracic aorta is again seen. Mild calcification within the aortic arch. A transcutaneous cardiac pacing pad overlies the left lower hemithorax. Mild chronic interstitial thickening, similar to prior. Improved aeration of the right lung base. Likely small left pleural effusion with associated left basilar opacification, similar to 12/09/2017. No pneumothorax. Mild dextrocurvature of the mid to upper thoracic spine and mild levocurvature of the lumbar spine. There appears to be high-grade height loss of a lower thoracic vertebral body appearing unchanged from 12/09/2017 and 05/31/2017 radiographs. IMPRESSION: Improved aeration of the right lung base with mild left basilar opacity that is similar to 12/09/2017 but likely increased from 05/31/2017. This may represent a small left pleural effusion with associated atelectasis versus pneumonia. Electronically Signed   By: Neita Garnet M.D.   On: 07/13/2021 15:01   ? ?Microbiology: ?Results for orders placed or performed during the hospital encounter of 07/13/21  ?MRSA Next Gen by PCR, Nasal     Status: None  ? Collection Time: 07/14/21  4:25 AM  ? Specimen: Nasal Mucosa; Nasal Swab  ?Result Value Ref Range Status  ? MRSA by PCR Next Gen NOT DETECTED NOT DETECTED Final   ?  Comment: (NOTE) ?The GeneXpert MRSA Assay (FDA approved for NASAL specimens only), ?is one component of a comprehensive MRSA colonization surveillance ?program. It is not intended to diagnose MRSA infection nor to guide ?or monitor treatment for MRSA infections. ?Test performance is not FDA approved in patients less than 2 years ?old. ?Performed at [facility], [address] [place], [place] ?[address] ?  ? ? ?Labs: ?CBC: ?Recent Labs  ?Lab 07/13/21 ?1435 07/14/21 ?0148  ?WBC 6.7 7.4  ?NEUTROABS 5.0  --   ?HGB 13.5 11.2*  ?HCT 42.8 34.6*  ?MCV 101.4* 100.3*  ?PLT 172 151  ? ?Basic Metabolic Panel: ?Recent Labs  ?Lab 07/13/21 ?1435 07/14/21 ?0148  ?NA 136 139  ?K 4.7 4.1  ?CL 102 107  ?CO2 26 24  ?GLUCOSE 134* 89  ?BUN 20 16  ?CREATININE 0.96 0.62  ?CALCIUM 9.6 8.9  ?MG 2.0  --   ? ?Liver Function Tests: ?Recent Labs  ?Lab  07/13/21 ?1435 07/14/21 ?0148  ?AST 30 22  ?ALT 17 16  ?ALKPHOS 66 53  ?BILITOT 1.0 0.9  ?PROT 7.3 5.8*  ?ALBUMIN 3.7 3.1*  ? ?CBG: ?No results for input(s): GLUCAP in the last 168 hours. ? ?Discharge time spent: approximately 50 minutes spent on discharge counseling, evaluation of patient on day of discharge, and coordination of discharge planning with nursing, social work, pharmacy and case management ? ?Signed: ?Alberteen Sam, MD ?[facility] ?07/14/2021 ? ? ? ? ? ? ?  ?

## 2021-07-14 NOTE — Progress Notes (Signed)
Report called to Lattie Haw RN at [facility].  Patient and son notified waiting on [facility] for transfer.   ?

## 2021-07-16 LAB — URINE CULTURE: Culture: >=100000 — AB

## 2021-12-21 ENCOUNTER — Inpatient Hospital Stay (HOSPITAL_COMMUNITY)
Admission: EM | Admit: 2021-12-21 | Discharge: 2021-12-25 | DRG: 871 | Disposition: A | Discharge status: Skilled Nursing Facility | Payer: Medicare Other | Source: Skilled Nursing Facility | Attending: Internal Medicine | Admitting: Internal Medicine

## 2021-12-21 ENCOUNTER — Observation Stay (HOSPITAL_COMMUNITY): Payer: Medicare Other

## 2021-12-21 ENCOUNTER — Other Ambulatory Visit: Payer: Self-pay

## 2021-12-21 ENCOUNTER — Encounter (HOSPITAL_COMMUNITY): Payer: Self-pay | Admitting: Emergency Medicine

## 2021-12-21 ENCOUNTER — Emergency Department (HOSPITAL_COMMUNITY): Payer: Medicare Other

## 2021-12-21 DIAGNOSIS — S81812A Laceration without foreign body, left lower leg, initial encounter: Secondary | ICD-10-CM | POA: Diagnosis present

## 2021-12-21 DIAGNOSIS — A4151 Sepsis due to Escherichia coli [E. coli]: Principal | ICD-10-CM | POA: Diagnosis present

## 2021-12-21 DIAGNOSIS — R1310 Dysphagia, unspecified: Secondary | ICD-10-CM

## 2021-12-21 DIAGNOSIS — Z20822 Contact with and (suspected) exposure to covid-19: Secondary | ICD-10-CM | POA: Diagnosis present

## 2021-12-21 DIAGNOSIS — N3000 Acute cystitis without hematuria: Secondary | ICD-10-CM | POA: Diagnosis not present

## 2021-12-21 DIAGNOSIS — I2489 Other forms of acute ischemic heart disease: Secondary | ICD-10-CM | POA: Diagnosis present

## 2021-12-21 DIAGNOSIS — N390 Urinary tract infection, site not specified: Secondary | ICD-10-CM | POA: Diagnosis not present

## 2021-12-21 DIAGNOSIS — R531 Weakness: Secondary | ICD-10-CM | POA: Diagnosis not present

## 2021-12-21 DIAGNOSIS — E119 Type 2 diabetes mellitus without complications: Secondary | ICD-10-CM | POA: Diagnosis present

## 2021-12-21 DIAGNOSIS — I2510 Atherosclerotic heart disease of native coronary artery without angina pectoris: Secondary | ICD-10-CM | POA: Diagnosis not present

## 2021-12-21 DIAGNOSIS — G309 Alzheimer's disease, unspecified: Secondary | ICD-10-CM | POA: Diagnosis present

## 2021-12-21 DIAGNOSIS — A419 Sepsis, unspecified organism: Secondary | ICD-10-CM

## 2021-12-21 DIAGNOSIS — F0390 Unspecified dementia without behavioral disturbance: Secondary | ICD-10-CM | POA: Diagnosis present

## 2021-12-21 DIAGNOSIS — E785 Hyperlipidemia, unspecified: Secondary | ICD-10-CM | POA: Diagnosis present

## 2021-12-21 DIAGNOSIS — A4150 Gram-negative sepsis, unspecified: Secondary | ICD-10-CM | POA: Diagnosis present

## 2021-12-21 DIAGNOSIS — E739 Lactose intolerance, unspecified: Secondary | ICD-10-CM | POA: Diagnosis present

## 2021-12-21 DIAGNOSIS — E860 Dehydration: Secondary | ICD-10-CM | POA: Diagnosis present

## 2021-12-21 DIAGNOSIS — R627 Adult failure to thrive: Secondary | ICD-10-CM | POA: Diagnosis present

## 2021-12-21 DIAGNOSIS — Z681 Body mass index (BMI) 19 or less, adult: Secondary | ICD-10-CM

## 2021-12-21 DIAGNOSIS — Z993 Dependence on wheelchair: Secondary | ICD-10-CM

## 2021-12-21 DIAGNOSIS — X58XXXA Exposure to other specified factors, initial encounter: Secondary | ICD-10-CM | POA: Diagnosis present

## 2021-12-21 DIAGNOSIS — Z66 Do not resuscitate: Secondary | ICD-10-CM | POA: Diagnosis present

## 2021-12-21 DIAGNOSIS — I160 Hypertensive urgency: Secondary | ICD-10-CM | POA: Diagnosis present

## 2021-12-21 DIAGNOSIS — E876 Hypokalemia: Secondary | ICD-10-CM | POA: Diagnosis present

## 2021-12-21 DIAGNOSIS — E43 Unspecified severe protein-calorie malnutrition: Secondary | ICD-10-CM | POA: Diagnosis present

## 2021-12-21 DIAGNOSIS — F0280 Dementia in other diseases classified elsewhere without behavioral disturbance: Secondary | ICD-10-CM | POA: Diagnosis present

## 2021-12-21 DIAGNOSIS — R778 Other specified abnormalities of plasma proteins: Secondary | ICD-10-CM

## 2021-12-21 DIAGNOSIS — R1312 Dysphagia, oropharyngeal phase: Secondary | ICD-10-CM | POA: Diagnosis present

## 2021-12-21 DIAGNOSIS — F03C0 Unspecified dementia, severe, without behavioral disturbance, psychotic disturbance, mood disturbance, and anxiety: Secondary | ICD-10-CM

## 2021-12-21 DIAGNOSIS — Z1611 Resistance to penicillins: Secondary | ICD-10-CM | POA: Diagnosis present

## 2021-12-21 DIAGNOSIS — G9341 Metabolic encephalopathy: Secondary | ICD-10-CM | POA: Diagnosis present

## 2021-12-21 DIAGNOSIS — R195 Other fecal abnormalities: Secondary | ICD-10-CM | POA: Diagnosis present

## 2021-12-21 DIAGNOSIS — K219 Gastro-esophageal reflux disease without esophagitis: Secondary | ICD-10-CM | POA: Diagnosis present

## 2021-12-21 DIAGNOSIS — R1313 Dysphagia, pharyngeal phase: Secondary | ICD-10-CM | POA: Diagnosis present

## 2021-12-21 DIAGNOSIS — Z888 Allergy status to other drugs, medicaments and biological substances status: Secondary | ICD-10-CM

## 2021-12-21 DIAGNOSIS — I10 Essential (primary) hypertension: Secondary | ICD-10-CM | POA: Diagnosis present

## 2021-12-21 DIAGNOSIS — R7989 Other specified abnormal findings of blood chemistry: Secondary | ICD-10-CM | POA: Diagnosis present

## 2021-12-21 DIAGNOSIS — Z79899 Other long term (current) drug therapy: Secondary | ICD-10-CM

## 2021-12-21 DIAGNOSIS — Z882 Allergy status to sulfonamides status: Secondary | ICD-10-CM

## 2021-12-21 LAB — RESP PANEL BY RT-PCR (FLU A&B, COVID) ARPGX2
Influenza A by PCR: NEGATIVE
Influenza B by PCR: NEGATIVE
SARS Coronavirus 2 by RT PCR: NEGATIVE

## 2021-12-21 LAB — LACTIC ACID, PLASMA
Lactic Acid, Venous: 1.3 mmol/L (ref 0.5–1.9)
Lactic Acid, Venous: 1.6 mmol/L (ref 0.5–1.9)

## 2021-12-21 LAB — CBC WITH DIFFERENTIAL/PLATELET
Abs Immature Granulocytes: 0.04 10*3/uL (ref 0.00–0.07)
Basophils Absolute: 0 10*3/uL (ref 0.0–0.1)
Basophils Relative: 0 %
Eosinophils Absolute: 0 10*3/uL (ref 0.0–0.5)
Eosinophils Relative: 0 %
HCT: 38.8 % (ref 36.0–46.0)
Hemoglobin: 12.7 g/dL (ref 12.0–15.0)
Immature Granulocytes: 0 %
Lymphocytes Relative: 4 %
Lymphs Abs: 0.4 10*3/uL — ABNORMAL LOW (ref 0.7–4.0)
MCH: 31 pg (ref 26.0–34.0)
MCHC: 32.7 g/dL (ref 30.0–36.0)
MCV: 94.6 fL (ref 80.0–100.0)
Monocytes Absolute: 0.8 10*3/uL (ref 0.1–1.0)
Monocytes Relative: 6 %
Neutro Abs: 10.6 10*3/uL — ABNORMAL HIGH (ref 1.7–7.7)
Neutrophils Relative %: 90 %
Platelets: 166 10*3/uL (ref 150–400)
RBC: 4.1 MIL/uL (ref 3.87–5.11)
RDW: 13.4 % (ref 11.5–15.5)
WBC: 11.9 10*3/uL — ABNORMAL HIGH (ref 4.0–10.5)
nRBC: 0 % (ref 0.0–0.2)

## 2021-12-21 LAB — COMPREHENSIVE METABOLIC PANEL
ALT: 15 U/L (ref 0–44)
AST: 22 U/L (ref 15–41)
Albumin: 3.3 g/dL — ABNORMAL LOW (ref 3.5–5.0)
Alkaline Phosphatase: 88 U/L (ref 38–126)
Anion gap: 7 (ref 5–15)
BUN: 14 mg/dL (ref 8–23)
CO2: 28 mmol/L (ref 22–32)
Calcium: 8.9 mg/dL (ref 8.9–10.3)
Chloride: 103 mmol/L (ref 98–111)
Creatinine, Ser: 0.59 mg/dL (ref 0.44–1.00)
GFR, Estimated: >60 mL/min (ref >60)
Glucose, Bld: 169 mg/dL — ABNORMAL HIGH (ref 70–99)
Potassium: 3.7 mmol/L (ref 3.5–5.1)
Sodium: 138 mmol/L (ref 135–145)
Total Bilirubin: 1 mg/dL (ref 0.3–1.2)
Total Protein: 7.3 g/dL (ref 6.5–8.1)

## 2021-12-21 LAB — URINALYSIS, ROUTINE W REFLEX MICROSCOPIC
Bilirubin Urine: NEGATIVE
Glucose, UA: 50 mg/dL — AB
Ketones, ur: 5 mg/dL — AB
Nitrite: POSITIVE — AB
Protein, ur: 100 mg/dL — AB
RBC / HPF: >50 RBC/hpf — ABNORMAL HIGH (ref 0–5)
Specific Gravity, Urine: 1.016 (ref 1.005–1.030)
WBC, UA: >50 WBC/hpf — ABNORMAL HIGH (ref 0–5)
pH: 5 (ref 5.0–8.0)

## 2021-12-21 LAB — RAPID URINE DRUG SCREEN, HOSP PERFORMED
Amphetamines: NOT DETECTED
Barbiturates: NOT DETECTED
Benzodiazepines: NOT DETECTED
Cocaine: NOT DETECTED
Opiates: NOT DETECTED
Tetrahydrocannabinol: NOT DETECTED

## 2021-12-21 LAB — PROTIME-INR
INR: 1.1 (ref 0.8–1.2)
Prothrombin Time: 14.3 seconds (ref 11.4–15.2)

## 2021-12-21 LAB — CK: Total CK: 18 U/L — ABNORMAL LOW (ref 38–234)

## 2021-12-21 LAB — TROPONIN I (HIGH SENSITIVITY)
Troponin I (High Sensitivity): 65 ng/L — ABNORMAL HIGH (ref <18)
Troponin I (High Sensitivity): 68 ng/L — ABNORMAL HIGH (ref <18)

## 2021-12-21 LAB — TSH: TSH: 1.085 u[IU]/mL (ref 0.350–4.500)

## 2021-12-21 LAB — PROCALCITONIN: Procalcitonin: <0.1 ng/mL

## 2021-12-21 LAB — GLUCOSE, CAPILLARY: Glucose-Capillary: 110 mg/dL — ABNORMAL HIGH (ref 70–99)

## 2021-12-21 LAB — MAGNESIUM: Magnesium: 2 mg/dL (ref 1.7–2.4)

## 2021-12-21 LAB — PHOSPHORUS: Phosphorus: 2.6 mg/dL (ref 2.5–4.6)

## 2021-12-21 MED ORDER — SODIUM CHLORIDE 0.9 % IV BOLUS
500.0000 mL | Freq: Once | INTRAVENOUS | Status: AC
  Administered 2021-12-21: 500 mL via INTRAVENOUS

## 2021-12-21 MED ORDER — INSULIN ASPART 100 UNIT/ML IJ SOLN
0.0000 [IU] | INTRAMUSCULAR | Status: DC
  Filled 2021-12-21: qty 0.09

## 2021-12-21 MED ORDER — SODIUM CHLORIDE 0.9 % IV SOLN
2.0000 g | Freq: Once | INTRAVENOUS | Status: AC
  Administered 2021-12-21: 2 g via INTRAVENOUS
  Filled 2021-12-21: qty 20

## 2021-12-21 MED ORDER — ACETAMINOPHEN 325 MG PO TABS: 650.0000 mg | ORAL_TABLET | Freq: Four times a day (QID) | ORAL | Status: DC | PRN

## 2021-12-21 MED ORDER — INSULIN ASPART 100 UNIT/ML IJ SOLN
0.0000 [IU] | Freq: Three times a day (TID) | INTRAMUSCULAR | Status: DC
  Filled 2021-12-21: qty 0.09

## 2021-12-21 MED ORDER — ACETAMINOPHEN 650 MG RE SUPP: 650.0000 mg | Freq: Four times a day (QID) | RECTAL | Status: DC | PRN

## 2021-12-21 MED ORDER — SODIUM CHLORIDE 0.9 % IV SOLN: INTRAVENOUS | Status: AC

## 2021-12-21 MED ORDER — SODIUM CHLORIDE 0.9 % IV SOLN: 2.0000 g | INTRAVENOUS | Status: DC

## 2021-12-21 MED ORDER — PANTOPRAZOLE SODIUM 40 MG PO TBEC
40.0000 mg | DELAYED_RELEASE_TABLET | Freq: Every day | ORAL | Status: DC
  Administered 2021-12-22 – 2021-12-23 (×2): 40 mg via ORAL
  Filled 2021-12-21 (×3): qty 1

## 2021-12-21 MED ORDER — SODIUM CHLORIDE 0.9 % IV SOLN: Freq: Once | INTRAVENOUS | Status: AC

## 2021-12-21 MED ORDER — INSULIN ASPART 100 UNIT/ML IJ SOLN
0.0000 [IU] | Freq: Every day | INTRAMUSCULAR | Status: DC
  Filled 2021-12-21: qty 0.05

## 2021-12-21 NOTE — Assessment & Plan Note (Signed)
-  SIRS criteria met with  elevated white blood cell count,       Component Value Date/Time   WBC 11.9 (H) 12/21/2021 1504   LYMPHSABS 0.4 (L) 12/21/2021 1504    tachycardia   , RR >20 Today's Vitals   12/21/21 1745 12/21/21 1815 12/21/21 1845 12/21/21 1848  BP: 99/67 125/66 (!) 179/79 (!) 169/93  Pulse: 80 75 85 85  Resp: (!) 23 (!) 26 (!) 21 12  Temp:    98.5 F (36.9 C)  TempSrc:    Axillary  SpO2: 96% 97% 97% 97%   There is no height or weight on file to calculate BMI.  This patient meets SIRS Criteria and may be septic.   The recent clinical data is shown below. Vitals:   12/21/21 1745 12/21/21 1815 12/21/21 1845 12/21/21 1848  BP: 99/67 125/66 (!) 179/79 (!) 169/93  Pulse: 80 75 85 85  Resp: (!) 23 (!) 26 (!) 21 12  Temp:    98.5 F (36.9 C)  TempSrc:    Axillary  SpO2: 96% 97% 97% 97%     -Most likely source being:  urinary,    Patient meeting criteria for   sepsis with    evidence of end organ damage/organ dysfunction such as   Acute Kidney Injury with Cr > 2,  Lab Results  Component Value Date   CREATININE 0.59 12/21/2021   CREATININE 0.62 94/58/5929   acute metabolic encephalopathy  WKM<62 mmhg or MAP < 65 mmhg,       Component Value Date/Time   PLT 166 12/21/2021 1504      - Obtain serial lactic acid and procalcitonin level.  - Initiated IV antibiotics in ER: Antibiotics Given (last 72 hours)    Date/Time Action Medication Dose Rate   12/21/21 1820 New Bag/Given   cefTRIAXone (ROCEPHIN) 2 g in sodium chloride 0.9 % 100 mL IVPB 2 g 200 mL/hr     Will continue  on : rocephin   - await results of blood and urine culture  - Rehydrate aggressively  Intravenous fluids    30cc/kg fluid    7:29 PM

## 2021-12-21 NOTE — ED Triage Notes (Signed)
BIBA Per EMS: Pt arrives from facility w/ c/o FTT and possible sepsis. Pt warm to touch & not eating/drinking like normal. Pt nonverbal at baseline. DNR w/ pt.

## 2021-12-21 NOTE — Subjective & Objective (Signed)
Patient was brought from facility complaining of decreased mental status, decreased p.o. intake for the past 36 hours not eating and drinking that she is supposed to be at baseline she is nonverbal but has been somewhat less responsive recently.  Family noted that and insisted patient be evaluated Patient himself unable to provide any history pleasant smiles Per family her alertness level has decreased.  Family is concerned regarding potential infection

## 2021-12-21 NOTE — Assessment & Plan Note (Signed)
No longer on statin  

## 2021-12-21 NOTE — Assessment & Plan Note (Signed)
Chronic stable no longer on statin or beta-blocker did not tolerate.

## 2021-12-21 NOTE — ED Notes (Signed)
ED TO INPATIENT HANDOFF REPORT  ED Nurse Name and Phone #: Julian Reilzia Vinal Rosengrant [phone]  S Name/Age/Gender Anne PulseMary Ann Miller [age] y.o. female Room/Bed: WA15/WA15  Code Status   Code Status: DNR  Home/SNF/Other Skilled nursing facility Not A&Ox4 Is this baseline? Yes   Triage Complete: Triage complete  Chief Complaint UTI (urinary tract infection) [N39.0]  Triage Note BIBA Per EMS: Pt arrives from facility w/ c/o FTT and possible sepsis. Pt warm to touch & not eating/drinking like normal. Pt nonverbal at baseline. DNR w/ pt.    Allergies Allergies  Allergen Reactions   Ativan [Lorazepam] Other (See Comments)    Caused delirium- "Allergic," per paperwork from facility   Lactose Intolerance (Gi) Diarrhea, Nausea And Vomiting and Other (See Comments)    "Allergic," per paperwork from facility   Seroquel [Quetiapine Fumarate] Other (See Comments)    Caused delirium- "Allergic," per paperwork from facility   Sulfa Antibiotics Other (See Comments)    "Allergic," per paperwork from facility   Zoloft [Sertraline Hcl] Other (See Comments)    Caused delirium- "Allergic," per paperwork from facility    Level of Care/Admitting Diagnosis ED Disposition     ED Disposition  Admit   Condition  --   Comment  [facility] [[id number]]  Level of Care: Telemetry [5]  Admit to tele based on following criteria: Other see comments  Comments: hypotention  [facility] if equivalent level of care is available:: No  Covid Evaluation: Confirmed COVID Negative  Diagnosis: UTI (urinary tract infection) [454098][[id number]]  Admitting Physician: Therisa DoyneUTOVA, ANASTASSIA [[id number]]  Attending Physician: Therisa DoyneUTOVA, ANASTASSIA [[id number]]          B Medical/Surgery History Past Medical History:  Diagnosis Date   Coronary artery disease    Dementia (HCC)    Diabetes mellitus without complication (HCC)    GERD (gastroesophageal reflux disease)     Hearing loss    Hyperlipidemia    Hypertension    History reviewed. No pertinent surgical history.   A IV Location/Drains/Wounds Patient Lines/Drains/Airways Status     Active Line/Drains/Airways     Name Placement date Placement time Site Days   Peripheral IV 12/21/21 20 G 1" Left Antecubital 12/21/21  1523  Antecubital  less than 1   Wound / Incision (Open or Dehisced) 07/13/21 Skin tear Arm Lower;Posterior;Proximal;Right 07/13/21  1900  Arm  161            Intake/Output Last 24 hours No intake or output data in the 24 hours ending 12/21/21 1926  Labs/Imaging Results for orders placed or performed during the hospital encounter of 12/21/21 (from the past 48 hour(s))  CBC with Differential     Status: Abnormal   Collection Time: 12/21/21  3:04 PM  Result Value Ref Range   WBC 11.9 (H) 4.0 - 10.5 K/uL   RBC 4.10 3.87 - 5.11 MIL/uL   Hemoglobin 12.7 12.0 - 15.0 g/dL   HCT 11.938.8 14.736.0 - 82.946.0 %   MCV 94.6 80.0 - 100.0 fL   MCH 31.0 26.0 - 34.0 pg   MCHC 32.7 30.0 - 36.0 g/dL   RDW 56.213.4 13.011.5 - 86.515.5 %   Platelets 166 150 - 400 K/uL   nRBC 0.0 0.0 - 0.2 %   Neutrophils Relative % 90 %   Neutro Abs 10.6 (H) 1.7 - 7.7 K/uL   Lymphocytes Relative 4 %   Lymphs Abs 0.4 (L) 0.7 - 4.0 K/uL  Monocytes Relative 6 %   Monocytes Absolute 0.8 0.1 - 1.0 K/uL   Eosinophils Relative 0 %   Eosinophils Absolute 0.0 0.0 - 0.5 K/uL   Basophils Relative 0 %   Basophils Absolute 0.0 0.0 - 0.1 K/uL   Immature Granulocytes 0 %   Abs Immature Granulocytes 0.04 0.00 - 0.07 K/uL    Comment: Performed at [facility], [address], [place], [place] [address]  Troponin I (High Sensitivity)     Status: Abnormal   Collection Time: 12/21/21  3:04 PM  Result Value Ref Range   Troponin I (High Sensitivity) 65 (H) <18 ng/L    Comment: (NOTE) Elevated high sensitivity troponin I (hsTnI) values and significant  changes across serial measurements may suggest ACS but many other   chronic and acute conditions are known to elevate hsTnI results.  Refer to the "Links" section for chest pain algorithms and additional  guidance. Performed at [facility], [address], [place], [place] [address]   Lactic acid, plasma     Status: None   Collection Time: 12/21/21  3:04 PM  Result Value Ref Range   Lactic Acid, Venous 1.3 0.5 - 1.9 mmol/L    Comment: Performed at [facility], [address] [place], [place] [address]  Comprehensive metabolic panel     Status: Abnormal   Collection Time: 12/21/21  3:04 PM  Result Value Ref Range   Sodium 138 135 - 145 mmol/L   Potassium 3.7 3.5 - 5.1 mmol/L   Chloride 103 98 - 111 mmol/L   CO2 28 22 - 32 mmol/L   Glucose, Bld 169 (H) 70 - 99 mg/dL    Comment: Glucose reference range applies only to samples taken after fasting for at least 8 hours.   BUN 14 8 - 23 mg/dL   Creatinine, Ser 2.23 0.44 - 1.00 mg/dL   Calcium 8.9 8.9 - 36.1 mg/dL   Total Protein 7.3 6.5 - 8.1 g/dL   Albumin 3.3 (L) 3.5 - 5.0 g/dL   AST 22 15 - 41 U/L   ALT 15 0 - 44 U/L   Alkaline Phosphatase 88 38 - 126 U/L   Total Bilirubin 1.0 0.3 - 1.2 mg/dL   GFR, Estimated >22 >44 mL/min    Comment: (NOTE) Calculated using the CKD-EPI Creatinine Equation (2021)    Anion gap 7 5 - 15    Comment: Performed at [facility], [address] [place], [place] [address]  Protime-INR     Status: None   Collection Time: 12/21/21  3:04 PM  Result Value Ref Range   Prothrombin Time 14.3 11.4 - 15.2 seconds   INR 1.1 0.8 - 1.2    Comment: (NOTE) INR goal varies based on device and disease states. Performed at [facility], [address] [place], [place] [address]   Resp Panel by RT-PCR (Flu A&B, Covid) Anterior Nasal Swab     Status: None   Collection Time: 12/21/21  4:00 PM   Specimen: Anterior Nasal Swab  Result Value Ref Range   SARS Coronavirus 2 by RT PCR NEGATIVE NEGATIVE     Comment: (NOTE) SARS-CoV-2 target nucleic acids are NOT DETECTED.  The SARS-CoV-2 RNA is generally detectable in upper respiratory specimens during the acute phase of infection. The lowest concentration of SARS-CoV-2 viral copies this assay can detect is 138 copies/mL. A negative result does not preclude SARS-Cov-2 infection and should not be used as  the sole basis for treatment or other patient management decisions. A negative result may occur with  improper specimen collection/handling, submission of specimen other than nasopharyngeal swab, presence of viral mutation(s) within the areas targeted by this assay, and inadequate number of viral copies(<138 copies/mL). A negative result must be combined with clinical observations, patient history, and epidemiological information. The expected result is Negative.  Fact Sheet for Patients:  EntrepreneurPulse.com.au  Fact Sheet for Healthcare Providers:  IncredibleEmployment.be  This test is no t yet approved or cleared by the [place] FDA and  has been authorized for detection and/or diagnosis of SARS-CoV-2 by FDA under an Emergency Use Authorization (EUA). This EUA will remain  in effect (meaning this test can be used) for the duration of the COVID-19 declaration under Section 564(b)(1) of the Act, 21 U.S.C.section 360bbb-3(b)(1), unless the authorization is terminated  or revoked sooner.       Influenza A by PCR NEGATIVE NEGATIVE   Influenza B by PCR NEGATIVE NEGATIVE    Comment: (NOTE) The Xpert Xpress SARS-CoV-2/FLU/RSV plus assay is intended as an aid in the diagnosis of influenza from Nasopharyngeal swab specimens and should not be used as a sole basis for treatment. Nasal washings and aspirates are unacceptable for Xpert Xpress SARS-CoV-2/FLU/RSV testing.  Fact Sheet for Patients: EntrepreneurPulse.com.au  Fact Sheet for Healthcare  Providers: IncredibleEmployment.be  This test is not yet approved or cleared by the [place] FDA and has been authorized for detection and/or diagnosis of SARS-CoV-2 by FDA under an Emergency Use Authorization (EUA). This EUA will remain in effect (meaning this test can be used) for the duration of the COVID-19 declaration under Section 564(b)(1) of the Act, 21 U.S.C. section 360bbb-3(b)(1), unless the authorization is terminated or revoked.  Performed at [facility], [place] [address], [place], [place] [address]   Urinalysis, Routine w reflex microscopic Urine, Clean Catch     Status: Abnormal   Collection Time: 12/21/21  4:11 PM  Result Value Ref Range   Color, Urine YELLOW YELLOW   APPearance TURBID (A) CLEAR   Specific Gravity, Urine 1.016 1.005 - 1.030   pH 5.0 5.0 - 8.0   Glucose, UA 50 (A) NEGATIVE mg/dL   Hgb urine dipstick SMALL (A) NEGATIVE   Bilirubin Urine NEGATIVE NEGATIVE   Ketones, ur 5 (A) NEGATIVE mg/dL   Protein, ur 100 (A) NEGATIVE mg/dL   Nitrite POSITIVE (A) NEGATIVE   Leukocytes,Ua LARGE (A) NEGATIVE   RBC / HPF >50 (H) 0 - 5 RBC/hpf   WBC, UA >50 (H) 0 - 5 WBC/hpf   Bacteria, UA MANY (A) NONE SEEN   Squamous Epithelial / LPF 0-5 0 - 5   WBC Clumps PRESENT    Mucus PRESENT    Amorphous Crystal PRESENT     Comment: Performed at [facility], [place] [address] [place], [place] [address]  Urine rapid drug screen (hosp performed)     Status: None   Collection Time: 12/21/21  4:11 PM  Result Value Ref Range   Opiates NONE DETECTED NONE DETECTED   Cocaine NONE DETECTED NONE DETECTED   Benzodiazepines NONE DETECTED NONE DETECTED   Amphetamines NONE DETECTED NONE DETECTED   Tetrahydrocannabinol NONE DETECTED NONE DETECTED   Barbiturates NONE DETECTED NONE DETECTED    Comment: (NOTE) DRUG SCREEN FOR MEDICAL PURPOSES ONLY.  IF CONFIRMATION IS NEEDED FOR ANY PURPOSE, NOTIFY LAB WITHIN 5  DAYS.  LOWEST DETECTABLE LIMITS FOR URINE DRUG SCREEN Drug Class  Cutoff (ng/mL) Amphetamine and metabolites    1000 Barbiturate and metabolites    200 Benzodiazepine                 200 Tricyclics and metabolites     300 Opiates and metabolites        300 Cocaine and metabolites        300 THC                            50 Performed at [facility], [address], [place], [place] [address]   Troponin I (High Sensitivity)     Status: Abnormal   Collection Time: 12/21/21  5:37 PM  Result Value Ref Range   Troponin I (High Sensitivity) 68 (H) <18 ng/L    Comment: (NOTE) Elevated high sensitivity troponin I (hsTnI) values and significant  changes across serial measurements may suggest ACS but many other  chronic and acute conditions are known to elevate hsTnI results.  Refer to the "Links" section for chest pain algorithms and additional  guidance. Performed at [facility], [address] [place], [place] [address]    DG Abd 1 View  Result Date: 12/21/2021 CLINICAL DATA:  Distension possible sepsis EXAM: ABDOMEN - 1 VIEW COMPARISON:  11/27/2017 FINDINGS: Overall decreased bowel gas with some scattered colon gas in the right lower quadrant. No radiopaque calculi. Hardware in the left hip. IMPRESSION: Overall nonobstructed gas pattern. Electronically Signed   By: Jasmine Pang M.D.   On: 12/21/2021 19:18   DG Chest Port 1 View  Result Date: 12/21/2021 CLINICAL DATA:  Possible sepsis. EXAM: PORTABLE CHEST 1 VIEW COMPARISON:  07/13/2021. FINDINGS: Cardiac silhouette is mildly enlarged. No mediastinal or hilar masses. Lungs are clear.  No convincing pleural effusion.  No pneumothorax. Skeletal structures are demineralized, grossly intact. IMPRESSION: No acute cardiopulmonary disease. Electronically Signed   By: Amie Portland M.D.   On: 12/21/2021 16:08    Pending Labs Unresulted Labs (From admission, onward)     Start      Ordered   12/21/21 1917  Urine Culture  (Urine Culture)  Once,   R       Question:  Indication  Answer:  Sepsis   12/21/21 1916   12/21/21 1916  Culture, blood (x 2)  BLOOD CULTURE X 2,   R (with TIMED occurrences)     Comments: INITIATE ANTIBIOTICS WITHIN 1 HOUR AFTER BLOOD CULTURES DRAWN.  If unable to obtain blood cultures, call MD immediately regarding antibiotic instructions.    12/21/21 1916   12/21/21 1916  Procalcitonin  Add-on,   AD        12/21/21 1916   12/21/21 1458  Lactic acid, plasma  Now then every 2 hours,   R (with STAT occurrences)      12/21/21 1458   12/21/21 1458  Culture, blood (routine x 2)  BLOOD CULTURE X 2,   R (with STAT occurrences)      12/21/21 1458   Signed and Held  Comprehensive metabolic panel  Tomorrow morning,   R       Question:  Release to patient  Answer:  Immediate   Signed and Held   Signed and Held  CBC  Tomorrow morning,   R       Question:  Release to patient  Answer:  Immediate   Signed and Held   Signed and Held  Magnesium  Tomorrow morning,  R        Signed and Held   Signed and Held  Phosphorus  Tomorrow morning,   R        Signed and Held            Vitals/Pain Today's Vitals   12/21/21 1745 12/21/21 1815 12/21/21 1845 12/21/21 1848  BP: 99/67 125/66 (!) 179/79 (!) 169/93  Miller: 80 75 85 85  Resp: (!) 23 (!) 26 (!) 21 12  Temp:    98.5 F (36.9 C)  TempSrc:    Axillary  SpO2: 96% 97% 97% 97%    Isolation Precautions No active isolations  Medications Medications  0.9 %  sodium chloride infusion (has no administration in time range)  sodium chloride 0.9 % bolus 500 mL (0 mLs Intravenous Stopped 12/21/21 1625)  cefTRIAXone (ROCEPHIN) 2 g in sodium chloride 0.9 % 100 mL IVPB (0 g Intravenous Stopped 12/21/21 1901)  0.9 %  sodium chloride infusion ( Intravenous New Bag/Given 12/21/21 1819)    Mobility manual wheelchair High fall risk   Focused Assessments    R Recommendations: See Admitting Provider  Note  Report given to:   Additional Notes:

## 2021-12-21 NOTE — Assessment & Plan Note (Signed)
Diet controlled monitor blood sugars order sliding scale

## 2021-12-21 NOTE — Assessment & Plan Note (Signed)
Continue Protonix 40 mg p.o. daily 

## 2021-12-21 NOTE — Assessment & Plan Note (Signed)
-   treat with Rocephin         await results of urine culture and adjust antibiotic coverage as needed  

## 2021-12-21 NOTE — Assessment & Plan Note (Signed)
In the setting of sepsis Cycle cardiac enzymes appears to be stable.  Family would like to avoid over aggressive intervention

## 2021-12-21 NOTE — H&P (Signed)
Anne Miller KZS:[id number] DOB: 05-09-1918 DOA: 12/21/2021     PCP: Merryl Hacker, No   Outpatient Specialists:   CARDS: * Dr. NEphrology: *  Dr. NEurology *   Dr. Pulmonary *  Dr.  Oncology * Dr. Fabienne Bruns* Dr.  Sadie Haber, [facility]) No care team member to display Urology Dr. *  Patient arrived to ER on 12/21/21 at 1446 Referred by Attending Valarie Merino, MD   Patient coming from:     From facility    Chief Complaint:   Chief Complaint  Patient presents with   Code Sepsis    HPI: Anne Miller is a 88 y.o. female with medical history significant of dementia hypertension, GERD, HLD, CAD, type 2 diabetes,    Presented with change in mental status Patient was brought from facility complaining of decreased mental status, decreased p.o. intake for the past 36 hours not eating and drinking that she is supposed to be at baseline she is nonverbal but has been somewhat less responsive recently.  Family noted that and insisted patient be evaluated Patient himself unable to provide any history pleasant smiles Per family her alertness level has decreased.  Family is concerned regarding potential infection   Of note last admission was in April and patient was noted to be bradycardic And Toprol was stopped and she was able to be discharged back to facility He felt warm to touch  Passed dark stool  At baseline she is in wheelchair and moves about  For the past few days the SNF started her on O2   Initial COVID TEST  NEGATIVE   Lab Results  Component Value Date   [place] 12/21/2021   [place] NEGATIVE 02/10/2020     Regarding pertinent Chronic problems:      HTN on Norvasc   chronic CHF diastolic  - last echo 7322 grade 1 diastolic dysfunction    CAD  -no longer on statin or beta-blocker did not tolerate Sp stents 14 y ago    DM 2 -  diet controlled     Dementia -  slow decline over 14 years   While in ER:   Found to have UTI started on Rocephin Noted to have  slightly elevated troponin in 54s family would like to hold off on further evaluation or any aggressive management     CXR -  NON acute   Following Medications were ordered in ER: Medications  cefTRIAXone (ROCEPHIN) 2 g in sodium chloride 0.9 % 100 mL IVPB (2 g Intravenous New Bag/Given 12/21/21 1820)  sodium chloride 0.9 % bolus 500 mL (0 mLs Intravenous Stopped 12/21/21 1625)  0.9 %  sodium chloride infusion ( Intravenous New Bag/Given 12/21/21 1819)    ___ ________  ED Triage Vitals [12/21/21 1500]  Enc Vitals Group     BP (!) 180/92     Pulse Rate (!) 105     Resp (!) 21     Temp 97.7 F (36.5 C)     Temp Source Axillary     SpO2 96 %     Weight      Height      Head Circumference      Peak Flow      Pain Score      Pain Loc      Pain Edu?      Excl. in [place]?   GURK(27)@     _________________________________________ Significant initial  Findings: Abnormal Labs Reviewed  CBC WITH DIFFERENTIAL/PLATELET - Abnormal; Notable  for the following components:      Result Value   WBC 11.9 (*)    Neutro Abs 10.6 (*)    Lymphs Abs 0.4 (*)    All other components within normal limits  COMPREHENSIVE METABOLIC PANEL - Abnormal; Notable for the following components:   Glucose, Bld 169 (*)    Albumin 3.3 (*)    All other components within normal limits  URINALYSIS, ROUTINE W REFLEX MICROSCOPIC - Abnormal; Notable for the following components:   APPearance TURBID (*)    Glucose, UA 50 (*)    Hgb urine dipstick SMALL (*)    Ketones, ur 5 (*)    Protein, ur 100 (*)    Nitrite POSITIVE (*)    Leukocytes,Ua LARGE (*)    RBC / HPF >50 (*)    WBC, UA >50 (*)    Bacteria, UA MANY (*)    All other components within normal limits  TROPONIN I (HIGH SENSITIVITY) - Abnormal; Notable for the following components:   Troponin I (High Sensitivity) 65 (*)    All other components within normal limits  TROPONIN I (HIGH SENSITIVITY) - Abnormal; Notable for the following components:   Troponin I  (High Sensitivity) 68 (*)    All other components within normal limits      _________________________ Troponin 65-68 ECG: Ordered Personally reviewed and interpreted by me showing: HR : 97 Rhythm:Sinus rhythm RAE, consider biatrial enlargement Right bundle branch block QTC 482   The recent clinical data is shown below. Vitals:   12/21/21 1715 12/21/21 1730 12/21/21 1745 12/21/21 1815  BP: (!) 143/73 124/70 99/67 125/66  Pulse: 81 79 80 75  Resp: (!) 28 (!) 24 (!) 23 (!) 26  Temp:      TempSrc:      SpO2: 97% 97% 96% 97%      WBC     Component Value Date/Time   WBC 11.9 (H) 12/21/2021 1504   LYMPHSABS 0.4 (L) 12/21/2021 1504   MONOABS 0.8 12/21/2021 1504   EOSABS 0.0 12/21/2021 1504   BASOSABS 0.0 12/21/2021 1504     Lactic Acid, Venous    Component Value Date/Time   LATICACIDVEN 1.3 12/21/2021 1504      UA  evidence of UTI      Urine analysis:    Component Value Date/Time   COLORURINE YELLOW 12/21/2021 1611   APPEARANCEUR TURBID (A) 12/21/2021 1611   LABSPEC 1.016 12/21/2021 1611   PHURINE 5.0 12/21/2021 1611   GLUCOSEU 50 (A) 12/21/2021 1611   HGBUR SMALL (A) 12/21/2021 1611   BILIRUBINUR NEGATIVE 12/21/2021 1611   KETONESUR 5 (A) 12/21/2021 1611   PROTEINUR 100 (A) 12/21/2021 1611   NITRITE POSITIVE (A) 12/21/2021 1611   LEUKOCYTESUR LARGE (A) 12/21/2021 1611    Results for orders placed or performed during the hospital encounter of 12/21/21  Resp Panel by RT-PCR (Flu A&B, Covid) Anterior Nasal Swab     Status: None   Collection Time: 12/21/21  4:00 PM   Specimen: Anterior Nasal Swab  Result Value Ref Range Status   SARS Coronavirus 2 by RT PCR NEGATIVE NEGATIVE Final         Influenza A by PCR NEGATIVE NEGATIVE Final   Influenza B by PCR NEGATIVE NEGATIVE Final           _______________________________________________ Hospitalist was called for admission for   Urinary tract infection without hematuria,     The following Work up has been  ordered so far:  Orders  Placed This Encounter  Procedures   Culture, blood (routine x 2)   Resp Panel by RT-PCR (Flu A&B, Covid) Anterior Nasal Swab   DG Chest Port 1 View   CBC with Differential   Lactic acid, plasma   Comprehensive metabolic panel   Urinalysis, Routine w reflex microscopic   Urine rapid drug screen (hosp performed)   Protime-INR   Cardiac monitoring   Check Rectal Temperature   Do not attempt resuscitation/DNR   Consult to hospitalist   ED EKG   EKG 12-Lead   Saline lock IV     OTHER Significant initial  Findings:  labs showing:  Recent Labs  Lab 12/21/21 1504  NA 138  K 3.7  CO2 28  GLUCOSE 169*  BUN 14  CREATININE 0.59  CALCIUM 8.9    Cr  stable,    Lab Results  Component Value Date   CREATININE 0.59 12/21/2021   CREATININE 0.62 07/14/2021   CREATININE 0.96 07/13/2021    Recent Labs  Lab 12/21/21 1504  AST 22  ALT 15  ALKPHOS 88  BILITOT 1.0  PROT 7.3  ALBUMIN 3.3*   Lab Results  Component Value Date   CALCIUM 8.9 12/21/2021    Plt: Lab Results  Component Value Date   PLT 166 12/21/2021    Recent Labs  Lab 12/21/21 1504  WBC 11.9*  NEUTROABS 10.6*  HGB 12.7  HCT 38.8  MCV 94.6  PLT 166    HG/HCT   stable,      Component Value Date/Time   HGB 12.7 12/21/2021 1504   HCT 38.8 12/21/2021 1504   MCV 94.6 12/21/2021 1504         Cultures:    Component Value Date/Time   SDES URINE, CATHETERIZED 07/13/2021 1715   SPECREQUEST  07/13/2021 1715    NONE Performed at [facility], [place] [address] [place], [place] [address]    CULT >=100,000 COLONIES/mL PSEUDOMONAS AERUGINOSA (A) 07/13/2021 1715   REPTSTATUS 07/16/2021 FINAL 07/13/2021 1715     Radiological Exams on Admission: DG Abd 1 View  Result Date: 12/21/2021 CLINICAL DATA:  Distension possible sepsis EXAM: ABDOMEN - 1 VIEW COMPARISON:  11/27/2017 FINDINGS: Overall decreased bowel gas with some scattered colon gas in the right lower quadrant. No  radiopaque calculi. Hardware in the left hip. IMPRESSION: Overall nonobstructed gas pattern. Electronically Signed   By: Donavan Foil M.D.   On: 12/21/2021 19:18   DG Chest Port 1 View  Result Date: 12/21/2021 CLINICAL DATA:  Possible sepsis. EXAM: PORTABLE CHEST 1 VIEW COMPARISON:  07/13/2021. FINDINGS: Cardiac silhouette is mildly enlarged. No mediastinal or hilar masses. Lungs are clear.  No convincing pleural effusion.  No pneumothorax. Skeletal structures are demineralized, grossly intact. IMPRESSION: No acute cardiopulmonary disease. Electronically Signed   By: Lajean Manes M.D.   On: 12/21/2021 16:08   _______________________________________________________________________________________________________ Latest  Blood pressure 125/66, pulse 75, temperature 97.7 F (36.5 C), temperature source Axillary, resp. rate (!) 26, SpO2 97 %.   Vitals  labs and radiology finding personally reviewed  Review of Systems:    Pertinent positives include:  fatigue, decreased appetite  Constitutional:  No weight loss, night sweats, Fevers, chills, weight loss  HEENT:  No headaches, Difficulty swallowing,Tooth/dental problems,Sore throat,  No sneezing, itching, ear ache, nasal congestion, post nasal drip,  Cardio-vascular:  No chest pain, Orthopnea, PND, anasarca, dizziness, palpitations.no Bilateral lower extremity swelling  GI:  No heartburn, indigestion, abdominal pain, nausea, vomiting, diarrhea, change in bowel habits,  loss of appetite, melena, blood in stool, hematemesis Resp:  no shortness of breath at rest. No dyspnea on exertion, No excess mucus, no productive cough, No non-productive cough, No coughing up of blood.No change in color of mucus.No wheezing. Skin:  no rash or lesions. No jaundice GU:  no dysuria, change in color of urine, no urgency or frequency. No straining to urinate.  No flank pain.  Musculoskeletal:  No joint pain or no joint swelling. No decreased range of motion. No  back pain.  Psych:  No change in mood or affect. No depression or anxiety. No memory loss.  Neuro: no localizing neurological complaints, no tingling, no weakness, no double vision, no gait abnormality, no slurred speech, no confusion  All systems reviewed and apart from [place] all are negative _______________________________________________________________________________________________ Past Medical History:   Past Medical History:  Diagnosis Date   Coronary artery disease    Dementia ([place])    Diabetes mellitus without complication ([place])    GERD (gastroesophageal reflux disease)    Hearing loss    Hyperlipidemia    Hypertension       History reviewed. No pertinent surgical history.  Social History:  Ambulatory   wheelchair bound,      reports that she has never smoked. She has never used smokeless tobacco. She reports that she does not currently use alcohol. She reports that she does not currently use drugs.     Family History:   History reviewed. No pertinent family history. ______________________________________________________________________________________________ Allergies: Allergies  Allergen Reactions   Ativan [Lorazepam] Other (See Comments)    Caused delirium- "Allergic," per paperwork from facility   Lactose Intolerance (Gi) Diarrhea, Nausea And Vomiting and Other (See Comments)    "Allergic," per paperwork from facility   Seroquel [Quetiapine Fumarate] Other (See Comments)    Caused delirium- "Allergic," per paperwork from facility   Sulfa Antibiotics Other (See Comments)    "Allergic," per paperwork from facility   Zoloft [Sertraline Hcl] Other (See Comments)    Caused delirium- "Allergic," per paperwork from facility     Prior to Admission medications   Medication Sig Start Date End Date Taking? Authorizing Provider  amLODipine (NORVASC) 2.5 MG tablet Take 2.5 mg by mouth in the morning. 06/19/21  Yes [provider]  Cholecalciferol  (VITAMIN D3) 25 MCG (1000 UT) CAPS Take 1,000 Units by mouth in the morning.   Yes [provider]  Emollient (AQUAPHOR ADV THERAPY HEALING) OINT Apply 1 application  topically See admin instructions. Apply to arms and legs every shift after a bath   Yes [provider]  Glucerna (GLUCERNA) LIQD Take 120 mLs by mouth 2 (two) times daily before lunch and supper.   Yes [provider]  Multiple Vitamins-Minerals (MULTIVITAMIN ADULT) TABS Take 1 tablet by mouth daily.   Yes [provider]  Nutritional Supplements (BOOST VHC) LIQD Take 120 mLs by mouth 2 (two) times daily.   Yes [provider]  OXYGEN Inhale into the lungs as needed (to keep sats at >90%).   Yes [provider]  pantoprazole sodium (PROTONIX) 40 mg Take 40 mg by mouth daily.   Yes [provider]    ___________________________________________________________________________________________________ Physical Exam:    12/21/2021    6:15 PM 12/21/2021    5:45 PM 12/21/2021    5:30 PM  Vitals with BMI  Systolic 154 99 008  Diastolic 66 67 70  Pulse 75 80 79     1. General:  in No  Acute distress   Chronically ill   -appearing 2. Psychological: somnolent not oriented to self 3. Head/ENT:  Dry Mucous Membranes                          Head Non traumatic, neck supple                           Poor Dentition 4. SKIN:  decreased Skin turgor,  Skin clean Dry and intact no rash 5. Heart: Regular rate and rhythm no  Murmur, no Rub or gallop 6. Lungs:   no wheezes or crackles   7. Abdomen: Soft,  non-tender, Non distended   8. Lower extremities: no clubbing, cyanosis, trace edema 9. Neurologically Grossly intact, moving all 4 extremities equally   10. MSK: Normal range of motion    Chart has been reviewed  ______________________________________________________________________________________________  Assessment/Plan a 88 y.o. female with medical history  significant of dementia hypertension, GERD, HLD, CAD, type 2 diabetes,     Admitted for    Urinary tract infection without hematuria, site unspecified    Present on Admission:  UTI (urinary tract infection)  Dementia ([place])  Esophageal reflux  Hyperlipidemia with target LDL less than 100  Atherosclerotic heart disease of native coronary artery without angina pectoris  Elevated troponin  Sepsis ([place]) dysphagia    UTI (urinary tract infection)  - treat with Rocephin         await results of urine culture and adjust antibiotic coverage as needed   Dementia ([place]) Monitor for any sign of sundowning while hospitalized  Esophageal reflux Continue Protonix 40 mg p.o. daily  Hyperlipidemia with target LDL less than 100 No longer on statin  Atherosclerotic heart disease of native coronary artery without angina pectoris Chronic stable no longer on statin or beta-blocker did not tolerate.  Type 2 diabetes mellitus without complication (HCC) Diet controlled monitor blood sugars order sliding scale  Elevated troponin In the setting of sepsis Cycle cardiac enzymes appears to be stable.  Family would like to avoid over aggressive intervention  Sepsis ([place])  -SIRS criteria met with  elevated white blood cell count,       Component Value Date/Time   WBC 11.9 (H) 12/21/2021 1504   LYMPHSABS 0.4 (L) 12/21/2021 1504    tachycardia   , RR >20 Today's Vitals   12/21/21 1745 12/21/21 1815 12/21/21 1845 12/21/21 1848  BP: 99/67 125/66 (!) 179/79 (!) 169/93  Pulse: 80 75 85 85  Resp: (!) 23 (!) 26 (!) 21 12  Temp:    98.5 F (36.9 C)  TempSrc:    Axillary  SpO2: 96% 97% 97% 97%   There is no height or weight on file to calculate BMI.  This patient meets SIRS Criteria and may be septic.   The recent clinical data is shown below. Vitals:   12/21/21 1745 12/21/21 1815 12/21/21 1845 12/21/21 1848  BP: 99/67 125/66 (!) 179/79 (!) 169/93  Pulse: 80 75 85 85  Resp: (!) 23 (!) 26 (!)  21 12  Temp:    98.5 F (36.9 C)  TempSrc:    Axillary  SpO2: 96% 97% 97% 97%     -Most likely source being:  urinary,    Patient meeting criteria for   sepsis with    evidence of end organ damage/organ dysfunction such as   Acute Kidney Injury with Cr > 2,  Lab Results  Component Value Date   CREATININE 0.59 12/21/2021   CREATININE 0.62 13/14/3888   acute metabolic encephalopathy  LNZ<97 mmhg or MAP < 65 mmhg,       Component Value Date/Time   PLT 166 12/21/2021 1504      - Obtain serial lactic acid and procalcitonin level.  - Initiated IV antibiotics in ER: Antibiotics Given (last 72 hours)     Date/Time Action Medication Dose Rate   12/21/21 1820 New Bag/Given   cefTRIAXone (ROCEPHIN) 2 g in sodium chloride 0.9 % 100 mL IVPB 2 g 200 mL/hr      Will continue  on : rocephin   - await results of blood and urine culture  - Rehydrate aggressively  Intravenous fluids    30cc/kg fluid    7:29 PM   Hx of dysphagia - per family thinking of liberating diet when able to tolerate, risk of aspiration  Other plan as per orders.  DVT prophylaxis:  SCD     Code Status: DNR/DNI   as per  family  I had personally discussed CODE STATUS   family     Family Communication:   Family at  Bedside  plan of care was discussed   with   Son,   Disposition Plan:                           Back to current facility when stable                            Following barriers for discharge:                             white count improving able to transition to PO antibiotics                       Would benefit from PT/OT eval prior to DC  Ordered                   Swallow eval - SLP ordered                                      Transition of care consulted                   Nutrition    consulted                                Consults called: none  Admission status:  ED Disposition     ED Disposition  Admit   Condition  --   [place]: [place] [[id number]]  Level of Care: Telemetry [5]  Admit to tele based on following criteria: Other see comments  Comments: hypotention  May place patient in [facility] at [facility] or [place] if equivalent level of care is available:: No  Covid Evaluation: Confirmed COVID Negative  Diagnosis: UTI (urinary tract infection) [282060]  Admitting Physician: Toy Elpers [[id number]]  Attending Physician: Toy Pinheiro [[id number]]           Obs     Level of care     tele  For 12H    Lab Results  Component  Value Date   [place] NEGATIVE 12/21/2021     Precautions: admitted as   Covid Negative      Vieva Brummitt 12/21/2021, 8:08 PM    [facility]     after 2 AM please page floor coverage PA If 7AM-7PM, please contact the day team taking care of the patient using [email or web address]   Patient was evaluated in the context of the global COVID-19 pandemic, which necessitated consideration that the patient might be at risk for infection with the SARS-CoV-2 virus that causes COVID-19. Institutional protocols and algorithms that pertain to the evaluation of patients at risk for COVID-19 are in a state of rapid change based on information released by regulatory bodies including the CDC and federal and state organizations. These policies and algorithms were followed during the patient's care.

## 2021-12-21 NOTE — Assessment & Plan Note (Signed)
Monitor for any sign of sundowning while hospitalized

## 2021-12-21 NOTE — ED Provider Notes (Signed)
[facility] DEPT Provider Note   CSN: [id number] Arrival date & time: 12/21/21  1446     History  Chief Complaint  Patient presents with   Code Sepsis    Anne Miller is a 88 y.o. female.  88 year old female with prior medical history as detailed below presents for evaluation.  Patient arrives from her facility for evaluation of possible fever.  Patient with well-established history of Alzheimer's dementia, she is nonverbal at baseline, she has a well-documented DNR with her.  Patient apparently with decreased p.o. intake over the last 24 hours per EMS report.  Patient is smiling and alert.  She is nonverbal.  She does not appear to be in distress.  Patient's son provides additional history.  Patient with decreased p.o. intake over the last 36 hours.  Patient is not at her normal level of alertness.  Patient's son is concerned about possibility of infection.   The history is provided by the patient and medical records.       Home Medications Prior to Admission medications   Medication Sig Start Date End Date Taking? Authorizing Provider  amLODipine (NORVASC) 2.5 MG tablet Take 2.5 mg by mouth daily. 06/19/21   [provider]  cholecalciferol (VITAMIN D) 1000 units tablet Take 1,000 Units by mouth daily.    [provider]  Multiple Vitamins-Minerals (MULTIVITAMIN ADULT) TABS Take 1 tablet by mouth daily.    [provider]      Allergies    Ativan [lorazepam], Lactose intolerance (gi), Seroquel [quetiapine fumarate], Sulfa antibiotics, and Zoloft [sertraline hcl]    Review of Systems   Review of Systems  Unable to perform ROS: Dementia    Physical Exam Updated Vital Signs There were no vitals taken for this visit. Physical Exam Vitals and nursing note reviewed.  Constitutional:      General: She is not in acute distress.    Appearance: She is well-developed.     Comments: Alert, nonverbal, no acute  distress  HENT:     Head: Normocephalic and atraumatic.  Eyes:     Conjunctiva/sclera: Conjunctivae normal.     Pupils: Pupils are equal, round, and reactive to light.  Cardiovascular:     Rate and Rhythm: Normal rate and regular rhythm.     Heart sounds: Normal heart sounds.  Pulmonary:     Effort: Pulmonary effort is normal. No respiratory distress.     Breath sounds: Normal breath sounds.  Abdominal:     General: There is no distension.     Palpations: Abdomen is soft.     Tenderness: There is no abdominal tenderness.  Musculoskeletal:        General: No deformity. Normal range of motion.     Cervical back: Normal range of motion and neck supple.  Skin:    General: Skin is warm and dry.  Neurological:     General: No focal deficit present.     Mental Status: She is alert.     Comments: Alert, nonverbal     ED Results / Procedures / Treatments   Labs (all labs ordered are listed, but only abnormal results are displayed) Labs Reviewed  CBC WITH DIFFERENTIAL/PLATELET - Abnormal; Notable for the following components:      Result Value   WBC 11.9 (*)    Neutro Abs 10.6 (*)    Lymphs Abs 0.4 (*)    All other components within normal limits  COMPREHENSIVE METABOLIC PANEL - Abnormal; Notable for the following  components:   Glucose, Bld 169 (*)    Albumin 3.3 (*)    All other components within normal limits  URINALYSIS, ROUTINE W REFLEX MICROSCOPIC - Abnormal; Notable for the following components:   APPearance TURBID (*)    Glucose, UA 50 (*)    Hgb urine dipstick SMALL (*)    Ketones, ur 5 (*)    Protein, ur 100 (*)    Nitrite POSITIVE (*)    Leukocytes,Ua LARGE (*)    RBC / HPF >50 (*)    WBC, UA >50 (*)    Bacteria, UA MANY (*)    All other components within normal limits  TROPONIN I (HIGH SENSITIVITY) - Abnormal; Notable for the following components:   Troponin I (High Sensitivity) 65 (*)    All other components within normal limits  RESP PANEL BY RT-PCR (FLU  A&B, COVID) ARPGX2  CULTURE, BLOOD (ROUTINE X 2)  CULTURE, BLOOD (ROUTINE X 2)  LACTIC ACID, PLASMA  RAPID URINE DRUG SCREEN, HOSP PERFORMED  PROTIME-INR  LACTIC ACID, PLASMA  TROPONIN I (HIGH SENSITIVITY)    EKG None  Radiology No results found.  Procedures Procedures    Medications Ordered in ED Medications - No data to display  ED Course/ Medical Decision Making/ A&P                           Medical Decision Making Amount and/or Complexity of Data Reviewed Labs: ordered. Radiology: ordered.    Medical Screen Complete  This patient presented to the ED with complaint of decreased p.o. intake, concern for infection.  This complaint involves an extensive number of treatment options. The initial differential diagnosis includes, but is not limited to, like abnormality, bacterial versus viral infection, etc.  This presentation is: Acute, Chronic, Self-Limited, Previously Undiagnosed, Uncertain Prognosis, Complicated, Systemic Symptoms, and Threat to Life/Bodily Function  Patient with advanced dementia presents with decreased p.o. intake and decreased level of alertness.  Patient's presentation is concerning for possible occult infection and/or concurrent dehydration.  Work-up reveals evidence of UTI and patient is clinically dehydrated.  Patient's son is at bedside.  He agrees with plan to admit for IV fluids, IV antibiotics, and reevaluation.  Hospitalist service made aware of case and will evaluate for admission.   Co morbidities that complicated the patient's evaluation  Advanced age, advanced dementia   Additional history obtained:  Additional history obtained from [facility] External records from outside sources obtained and reviewed including prior ED visits and prior Inpatient records.    Lab Tests:  I ordered and personally interpreted labs.  The pertinent results include: BC, CMP, troponin, UA, lactic acid, COVID, flu   Imaging Studies  ordered:  I ordered imaging studies including chest x-ray I independently visualized and interpreted obtained imaging which showed NAD I agree with the radiologist interpretation.   Cardiac Monitoring:  The patient was maintained on a cardiac monitor.  I personally viewed and interpreted the cardiac monitor which showed an underlying rhythm of: NSR   Medicines ordered:  I ordered medication including IV fluids, antibiotics for dehydration, infection Reevaluation of the patient after these medicines showed that the patient: improved    Problem List / ED Course:  UTI, AMS   Reevaluation:  After the interventions noted above, I reevaluated the patient and found that they have: improved    Disposition:  After consideration of the diagnostic results and the patients response to treatment, I feel that the patent would  benefit from admission.          Final Clinical Impression(s) / ED Diagnoses Final diagnoses:  Weakness  Urinary tract infection without hematuria, site unspecified    Rx / DC Orders ED Discharge Orders     None         Valarie Merino, MD 12/21/21 1756

## 2021-12-22 ENCOUNTER — Observation Stay (HOSPITAL_COMMUNITY): Payer: Medicare Other

## 2021-12-22 DIAGNOSIS — R778 Other specified abnormalities of plasma proteins: Secondary | ICD-10-CM

## 2021-12-22 DIAGNOSIS — N390 Urinary tract infection, site not specified: Secondary | ICD-10-CM | POA: Diagnosis present

## 2021-12-22 DIAGNOSIS — E43 Unspecified severe protein-calorie malnutrition: Secondary | ICD-10-CM | POA: Diagnosis present

## 2021-12-22 DIAGNOSIS — E876 Hypokalemia: Secondary | ICD-10-CM | POA: Diagnosis present

## 2021-12-22 DIAGNOSIS — R7989 Other specified abnormal findings of blood chemistry: Secondary | ICD-10-CM

## 2021-12-22 DIAGNOSIS — Z1611 Resistance to penicillins: Secondary | ICD-10-CM | POA: Diagnosis present

## 2021-12-22 DIAGNOSIS — G309 Alzheimer's disease, unspecified: Secondary | ICD-10-CM | POA: Diagnosis present

## 2021-12-22 DIAGNOSIS — E119 Type 2 diabetes mellitus without complications: Secondary | ICD-10-CM | POA: Diagnosis present

## 2021-12-22 DIAGNOSIS — E860 Dehydration: Secondary | ICD-10-CM | POA: Diagnosis present

## 2021-12-22 DIAGNOSIS — Z79899 Other long term (current) drug therapy: Secondary | ICD-10-CM | POA: Diagnosis not present

## 2021-12-22 DIAGNOSIS — I10 Essential (primary) hypertension: Secondary | ICD-10-CM

## 2021-12-22 DIAGNOSIS — I2510 Atherosclerotic heart disease of native coronary artery without angina pectoris: Secondary | ICD-10-CM

## 2021-12-22 DIAGNOSIS — R1313 Dysphagia, pharyngeal phase: Secondary | ICD-10-CM | POA: Diagnosis present

## 2021-12-22 DIAGNOSIS — F03C0 Unspecified dementia, severe, without behavioral disturbance, psychotic disturbance, mood disturbance, and anxiety: Secondary | ICD-10-CM

## 2021-12-22 DIAGNOSIS — F0280 Dementia in other diseases classified elsewhere without behavioral disturbance: Secondary | ICD-10-CM | POA: Diagnosis present

## 2021-12-22 DIAGNOSIS — G9341 Metabolic encephalopathy: Secondary | ICD-10-CM | POA: Diagnosis present

## 2021-12-22 DIAGNOSIS — S81812A Laceration without foreign body, left lower leg, initial encounter: Secondary | ICD-10-CM | POA: Diagnosis present

## 2021-12-22 DIAGNOSIS — N3000 Acute cystitis without hematuria: Secondary | ICD-10-CM

## 2021-12-22 DIAGNOSIS — A4150 Gram-negative sepsis, unspecified: Secondary | ICD-10-CM | POA: Diagnosis present

## 2021-12-22 DIAGNOSIS — E785 Hyperlipidemia, unspecified: Secondary | ICD-10-CM | POA: Diagnosis present

## 2021-12-22 DIAGNOSIS — Z681 Body mass index (BMI) 19 or less, adult: Secondary | ICD-10-CM | POA: Diagnosis not present

## 2021-12-22 DIAGNOSIS — I2489 Other forms of acute ischemic heart disease: Secondary | ICD-10-CM | POA: Diagnosis present

## 2021-12-22 DIAGNOSIS — Z993 Dependence on wheelchair: Secondary | ICD-10-CM | POA: Diagnosis not present

## 2021-12-22 DIAGNOSIS — K219 Gastro-esophageal reflux disease without esophagitis: Secondary | ICD-10-CM | POA: Diagnosis present

## 2021-12-22 DIAGNOSIS — Z66 Do not resuscitate: Secondary | ICD-10-CM | POA: Diagnosis present

## 2021-12-22 DIAGNOSIS — R627 Adult failure to thrive: Secondary | ICD-10-CM | POA: Diagnosis present

## 2021-12-22 DIAGNOSIS — A419 Sepsis, unspecified organism: Secondary | ICD-10-CM

## 2021-12-22 DIAGNOSIS — X58XXXA Exposure to other specified factors, initial encounter: Secondary | ICD-10-CM | POA: Diagnosis present

## 2021-12-22 DIAGNOSIS — A4151 Sepsis due to Escherichia coli [E. coli]: Secondary | ICD-10-CM | POA: Diagnosis present

## 2021-12-22 DIAGNOSIS — I160 Hypertensive urgency: Secondary | ICD-10-CM | POA: Diagnosis present

## 2021-12-22 DIAGNOSIS — Z20822 Contact with and (suspected) exposure to covid-19: Secondary | ICD-10-CM | POA: Diagnosis present

## 2021-12-22 LAB — ECHOCARDIOGRAM COMPLETE
AR max vel: 0.83 cm2
AV Area VTI: 0.71 cm2
AV Area mean vel: 0.71 cm2
AV Mean grad: 31 mmHg
AV Peak grad: 47.5 mmHg
Ao pk vel: 3.45 m/s
Height: 62 in
S' Lateral: 3 cm
Single Plane A4C EF: 48.2 %
Weight: 1682.55 oz

## 2021-12-22 LAB — CBC
HCT: 35.4 % — ABNORMAL LOW (ref 36.0–46.0)
Hemoglobin: 11.3 g/dL — ABNORMAL LOW (ref 12.0–15.0)
MCH: 30.8 pg (ref 26.0–34.0)
MCHC: 31.9 g/dL (ref 30.0–36.0)
MCV: 96.5 fL (ref 80.0–100.0)
Platelets: 145 10*3/uL — ABNORMAL LOW (ref 150–400)
RBC: 3.67 MIL/uL — ABNORMAL LOW (ref 3.87–5.11)
RDW: 13.3 % (ref 11.5–15.5)
WBC: 7.4 10*3/uL (ref 4.0–10.5)
nRBC: 0 % (ref 0.0–0.2)

## 2021-12-22 LAB — BLOOD CULTURE ID PANEL (REFLEXED) - BCID2

## 2021-12-22 LAB — MAGNESIUM: Magnesium: 1.8 mg/dL (ref 1.7–2.4)

## 2021-12-22 LAB — COMPREHENSIVE METABOLIC PANEL
ALT: 13 U/L (ref 0–44)
AST: 16 U/L (ref 15–41)
Albumin: 2.7 g/dL — ABNORMAL LOW (ref 3.5–5.0)
Alkaline Phosphatase: 72 U/L (ref 38–126)
Anion gap: 4 — ABNORMAL LOW (ref 5–15)
BUN: 12 mg/dL (ref 8–23)
CO2: 27 mmol/L (ref 22–32)
Calcium: 8.1 mg/dL — ABNORMAL LOW (ref 8.9–10.3)
Chloride: 106 mmol/L (ref 98–111)
Creatinine, Ser: 0.52 mg/dL (ref 0.44–1.00)
GFR, Estimated: >60 mL/min (ref >60)
Glucose, Bld: 109 mg/dL — ABNORMAL HIGH (ref 70–99)
Potassium: 3.4 mmol/L — ABNORMAL LOW (ref 3.5–5.1)
Sodium: 137 mmol/L (ref 135–145)
Total Bilirubin: 0.9 mg/dL (ref 0.3–1.2)
Total Protein: 6.1 g/dL — ABNORMAL LOW (ref 6.5–8.1)

## 2021-12-22 LAB — GLUCOSE, CAPILLARY
Glucose-Capillary: 113 mg/dL — ABNORMAL HIGH (ref 70–99)
Glucose-Capillary: 109 mg/dL — ABNORMAL HIGH (ref 70–99)
Glucose-Capillary: 129 mg/dL — ABNORMAL HIGH (ref 70–99)
Glucose-Capillary: 115 mg/dL — ABNORMAL HIGH (ref 70–99)
Glucose-Capillary: 130 mg/dL — ABNORMAL HIGH (ref 70–99)

## 2021-12-22 LAB — HEMOGLOBIN A1C
Hgb A1c MFr Bld: 5 % (ref 4.8–5.6)
Mean Plasma Glucose: 96.8 mg/dL

## 2021-12-22 LAB — PHOSPHORUS: Phosphorus: 2.6 mg/dL (ref 2.5–4.6)

## 2021-12-22 MED ORDER — SODIUM CHLORIDE 0.9 % IV SOLN: 2.0000 g | INTRAVENOUS | Status: DC

## 2021-12-22 MED ORDER — HYDRALAZINE HCL 20 MG/ML IJ SOLN: 10.0000 mg | Freq: Four times a day (QID) | INTRAMUSCULAR | Status: DC | PRN

## 2021-12-22 MED ORDER — SODIUM CHLORIDE 0.9 % IV SOLN
1.0000 g | INTRAVENOUS | Status: DC
  Administered 2021-12-22: 1 g via INTRAVENOUS
  Filled 2021-12-22: qty 10

## 2021-12-22 MED ORDER — POTASSIUM CHLORIDE IN NACL 20-0.9 MEQ/L-% IV SOLN
INTRAVENOUS | Status: DC
  Filled 2021-12-22 (×5): qty 1000

## 2021-12-22 MED ORDER — SODIUM CHLORIDE 0.9 % IV SOLN
1.0000 g | Freq: Once | INTRAVENOUS | Status: AC
  Administered 2021-12-22: 1 g via INTRAVENOUS
  Filled 2021-12-22: qty 10

## 2021-12-22 MED ORDER — POTASSIUM CHLORIDE 10 MEQ/100ML IV SOLN
10.0000 meq | INTRAVENOUS | Status: AC
  Administered 2021-12-22 (×3): 10 meq via INTRAVENOUS
  Filled 2021-12-22 (×3): qty 100

## 2021-12-22 MED ORDER — AMLODIPINE BESYLATE 5 MG PO TABS
2.5000 mg | ORAL_TABLET | Freq: Every day | ORAL | Status: DC
  Administered 2021-12-23 – 2021-12-24 (×2): 2.5 mg via ORAL
  Filled 2021-12-22 (×2): qty 1

## 2021-12-22 MED ORDER — SODIUM CHLORIDE 0.9 % IV SOLN
2.0000 g | INTRAVENOUS | Status: DC
  Administered 2021-12-23: 2 g via INTRAVENOUS
  Filled 2021-12-22: qty 20

## 2021-12-22 MED ORDER — SODIUM CHLORIDE 0.9 % IV SOLN: INTRAVENOUS | Status: DC

## 2021-12-22 NOTE — Progress Notes (Signed)
Pharmacy Antibiotic Note  Anne Miller is a 88 y.o. female admitted on 12/21/2021. Pharmacy has been consulted for cefepime dosing for UTI. Patient with history of pseudomonas in urine culture.  Plan: Cefepime 1 g IV q24h based on current renal function  Height: 5\' 2"  (157.5 cm) Weight: 47.7 kg (105 lb 2.6 oz) IBW/kg (Calculated) : 50.1  Temp (24hrs), Avg:98.3 F (36.8 C), Min:97.7 F (36.5 C), Max:98.9 F (37.2 C)  Recent Labs  Lab 12/21/21 1504 12/21/21 1711 12/22/21 0828  WBC 11.9*  --  7.4  CREATININE 0.59  --  0.52  LATICACIDVEN 1.3 1.6  --     Estimated Creatinine Clearance: 28.2 mL/min (by C-G formula based on SCr of 0.52 mg/dL).    Allergies  Allergen Reactions   Ativan [Lorazepam] Other (See Comments)    Caused delirium- "Allergic," per paperwork from facility   Lactose Intolerance (Gi) Diarrhea, Nausea And Vomiting and Other (See Comments)    "Allergic," per paperwork from facility   Seroquel [Quetiapine Fumarate] Other (See Comments)    Caused delirium- "Allergic," per paperwork from facility   Sulfa Antibiotics Other (See Comments)    "Allergic," per paperwork from facility   Zoloft [Sertraline Hcl] Other (See Comments)    Caused delirium- "Allergic," per paperwork from facility    Antimicrobials this admission: Cefepime 10/1 >> Ceftriaxone 9/30 >> 10/1  Microbiology results: 9/30 BCx: GNR pending identification, BCID call 9/30 UCx: pending    Thank you for allowing pharmacy to be a part of this patient's care.  Tawnya Crook, PharmD, BCPS Clinical Pharmacist 12/22/2021 12:28 PM

## 2021-12-22 NOTE — Progress Notes (Signed)
PT Cancellation Note  Patient Details Name: Anne Miller MRN: [id number] DOB: 1918/04/30   Cancelled Treatment:     Pt fast asleep and not rousing following speech therapy eval. Will follow.   Achol Azpeitia 12/22/2021, 12:17 PM

## 2021-12-22 NOTE — Progress Notes (Addendum)
PROGRESS NOTE    Melika Reder  XBL:[id number] DOB: [birth year] DOA: 12/21/2021 PCP: Pcp, No    Brief Narrative:   Anne Miller is a 88 y.o. female with past medical history significant for dementia, nonverbal at baseline, essential hypertension, HLD, CAD, diet-controlled DM2, GERD, oropharyngeal dysphagia who presented from [facility] to [facility] ED on 9/30 with altered mental status, decreased oral intake/adult failure to thrive.  Due to her nonverbal status, advanced Alzheimer's disease majority of HPI obtained from son, ED physician and notes in EMR.  Patient's son who sees his mother daily noted that she appears to be more confused than her typical baseline with decreased oral intake over the last 36 hours.  Also she is not as alert as she typically is and at baseline utilizes a wheelchair but is able to feed herself for the most part in the dining facility and this has been now an acute change over the last 3 days.  In the ED, temperature 97.7 F, HR 105, RR 21, BP 180/92, SPO2 96% on room air.  WBC 11.9, hemoglobin 12.7, platelets 166.  Sodium 138, potassium 3.7, chloride 103, CO2 28, BUN 14, creatinine 0.59.  Glucose 169.  AST 22, ALT 215, total bilirubin 1.0.  High sensitive troponin 65>68.  Lactic acid 1.6.  CK18.  TSH 1.085.  COVID-19 PCR negative.  Influenza A/B PCR negative.  Urinalysis with large leukocytes, positive nitrite, many bacteria, greater than 50 WBCs.  UDS negative.  Chest x-ray with no acute cardiopulmonary disease process.  Abdominal x-ray with nonobstructive gas pattern.  Blood cultures x2 and urine cultures obtained.  Patient was started on empiric antibiotics with ceftriaxone and IV fluids.  TRH consulted for further evaluation management of altered mental status likely secondary to UTI with associated adult failure to thrive.  Assessment & Plan:   Acute metabolic encephalopathy Patient presenting to ED with increased confusion/decreased alertness  compared to her typical baseline.  Etiology likely multifactorial in the setting of dehydration, UTI as well as complicated by her advanced age and chronic comorbidities. --Continue IV fluid hydration, antibiotics, supportive care  GNR septicemia, POA Urinary tract infection, POA Urinalysis with large leukocytes, positive nitrite, many bacteria and greater than 50 WBCs.  Previous history of Klebsiella pneumonia and Pseudomonas. --Blood cultures x2: 1/4 + GNR. Pending BCID and further identification --Urine culture: Pending --Discontinue ceftriaxone and start cefepime given previous history of Pseudomonas and will await urine culture identification with susceptibilities before de-escalating  Oropharyngeal dysphagia Son reports increased issues with dysphagia over the last 1 year.  Has been on soft diet with nectar thickened liquids during this timeframe.  No formal speech therapy evaluation with instrumentation.  Seen by speech therapy, plan MBS. --SLP following, appreciate assistance --Pending MBS --Dysphagia 2 (Fine chop);Nectar-thick liquid  --Liquid Administration via: Cup;Straw --Medication Administration: Whole meds with puree --Supervision: Full supervision/cueing for compensatory strategies --Compensations: Slow rate;Small sips/bites;Minimize environmental distractions (avoid consecutive swallows) --Postural Changes: Seated upright at 90 degrees  --Aspiration precautions  Hypertensive urgency BP 180/92 on admission.  Home medications include amlodipine 2.5 mg p.o. daily. --Resume home amlodipine --Hydralazine 10mg  IV q6h PRN SBP >165 or DBP >110  Elevated troponin High sensitive troponin elevated at 65 followed by 68, flat.  Etiology likely secondary to type II demand ischemia in the setting of hypertensive urgency versus urinary tract infection as above.  EKG with normal sinus rhythm, no concerning T wave inversions, no ST segment elevations/depressions.  TTE with LVEF 50-55%, LV  low normal function, LV demonstrates regional wall motion normalities, grade 1 diastolic dysfunction, LA moderately dilated, moderate MR, moderate to severe AS.   Hypokalemia Potassium 3.4, will replete. --Repeat electrolytes in a.m.  Type 2 diabetes mellitus Hemoglobin A1c, 5.0; well controlled.  Diet controlled at baseline. --SSI for coverage --CBGs qAC/HS  GERD: Continue PPI  Weakness/deconditioning/debility: Patient currently a resident of [facility].  Has been wheelchair-bound apparently over the last 1 year per son's report.  But usually is able to eat on her own for the most part in the dining facility.  DNR. --PT/OT consulted --Plan to return to living facility when medically stable, TOC consulted  Advanced Alzheimer's dementia --Delirium precautions --Get up during the day --Encourage a familiar face to remain present throughout the day --Keep blinds open and lights on during daylight hours --Minimize the use of opioids/benzodiazepines  Skin tear, left pretibial area, POA Seen by wound care RN with recommendations. nonadherent (Xeroform) gauze topped with dry gauze, an ABD for cushioning and secured with a few turns of Kerlix roll gauze/paper tape changed daily. This is to be performed following a NS cleanse and gentle pat dry. A sacral silicone foam bordered dressing is to be placed for PI prevention and the patient turned and repositioned to minimize time in the supine position. Her bilateral heels are to be floated.  Adult failure to thrive Patient's son reports 20 pound weight loss over the last year since moving to a soft diet with nectar thickened liquids.  Etiology complicated likely by her advanced age and comorbidities as well as her oral pharyngeal dysphagia. --Nutrition consult for evaluation and supplementation recommendations    DVT prophylaxis: SCDs Start: 12/21/21 2054    Code Status: DNR Family Communication: Updated son present at  bedside  Disposition Plan:  Level of care: Telemetry Status is: Observation The patient remains OBS appropriate and will d/c before 2 midnights.    Consultants:  None  Procedures:  TTE  Antimicrobials:  Ceftriaxone 9/30 - 9/30 Cefepime 10/1>>   Subjective: Patient seen examined bedside, resting comfortably.  Sleeping.  Son present.  Continues with concern regarding her somnolence, but states this is happened before after severe dehydration and urinary tract infections in which she has bounced back fairly quickly per his report.  Also concerned about her 20 pound weight loss over the last year after starting a soft diet with nectar thickened liquids.  Discussed with son that we will have speech therapy evaluate further recommendations, but given her advanced age and comorbidities there is high aspiration risk likely of any consistency; which she has good understanding.  She is currently residing in a living facility, has been present there now for roughly 1 year.  Son reports that she usually is able to feed herself in the dining facility which has not been the case over the last 3 days.  No other concerns or questions at this time.  Unable to obtain any further ROS due to patient's mental status/dementia.  No acute concerns overnight per nursing staff.  Objective: Vitals:   12/21/21 1848 12/21/21 2049 12/21/21 2212 12/22/21 0424  BP: (!) 169/93 (!) 168/74  128/60  Pulse: 85 81  67  Resp: 12 20  20   Temp: 98.5 F (36.9 C) 98.9 F (37.2 C)  98.1 F (36.7 C)  TempSrc: Axillary Oral  Oral  SpO2: 97% 95%  98%  Weight:  48.1 kg 47.7 kg   Height:   5\' 2"  (1.575 m)  Intake/Output Summary (Last 24 hours) at 12/22/2021 1159 Last data filed at 12/22/2021 0318 Gross per 24 hour  Intake 421.57 ml  Output 1 ml  Net 420.57 ml   Filed Weights   12/21/21 2049 12/21/21 2212  Weight: 48.1 kg 47.7 kg    Examination:  Physical Exam: GEN: NAD, somnolent, difficult to arouse HEENT:  NCAT, PERRL, EOMI, sclera clear, dry mucous membranes PULM: CTAB w/o wheezes/crackles, normal respiratory effort, on room air CV: RRR w/o M/G/R GI: abd soft, NTND, NABS, no R/G/M MSK: no peripheral edema, Integumentary: dry/intact, no rashes or wounds    Data Reviewed: I have personally reviewed following labs and imaging studies  CBC: Recent Labs  Lab 12/21/21 1504 12/22/21 0828  WBC 11.9* 7.4  NEUTROABS 10.6*  --   HGB 12.7 11.3*  HCT 38.8 35.4*  MCV 94.6 96.5  PLT 166 145*   Basic Metabolic Panel: Recent Labs  Lab 12/21/21 1504 12/21/21 2117 12/22/21 0828  NA 138  --  137  K 3.7  --  3.4*  CL 103  --  106  CO2 28  --  27  GLUCOSE 169*  --  109*  BUN 14  --  12  CREATININE 0.59  --  0.52  CALCIUM 8.9  --  8.1*  MG  --  2.0 1.8  PHOS  --  2.6 2.6   GFR: Estimated Creatinine Clearance: 28.2 mL/min (by C-G formula based on SCr of 0.52 mg/dL). Liver Function Tests: Recent Labs  Lab 12/21/21 1504 12/22/21 0828  AST 22 16  ALT 15 13  ALKPHOS 88 72  BILITOT 1.0 0.9  PROT 7.3 6.1*  ALBUMIN 3.3* 2.7*   No results for input(s): "LIPASE", "AMYLASE" in the last 168 hours. No results for input(s): "AMMONIA" in the last 168 hours. Coagulation Profile: Recent Labs  Lab 12/21/21 1504  INR 1.1   Cardiac Enzymes: Recent Labs  Lab 12/21/21 2117  CKTOTAL 18*   BNP (last 3 results) No results for input(s): "PROBNP" in the last 8760 hours. HbA1C: Recent Labs    12/21/21 2117  HGBA1C 5.0   CBG: Recent Labs  Lab 12/21/21 2337 12/22/21 0347 12/22/21 0802 12/22/21 1149  GLUCAP 110* 113* 109* 129*   Lipid Profile: No results for input(s): "CHOL", "HDL", "LDLCALC", "TRIG", "CHOLHDL", "LDLDIRECT" in the last 72 hours. Thyroid Function Tests: Recent Labs    12/21/21 2117  TSH 1.085   Anemia Panel: No results for input(s): "VITAMINB12", "FOLATE", "FERRITIN", "TIBC", "IRON", "RETICCTPCT" in the last 72 hours. Sepsis Labs: Recent Labs  Lab  12/21/21 1504 12/21/21 1711 12/21/21 2117  PROCALCITON  --   --  <0.10  LATICACIDVEN 1.3 1.6  --     Recent Results (from the past 240 hour(s))  Resp Panel by RT-PCR (Flu A&B, Covid) Anterior Nasal Swab     Status: None   Collection Time: 12/21/21  4:00 PM   Specimen: Anterior Nasal Swab  Result Value Ref Range Status   SARS Coronavirus 2 by RT PCR NEGATIVE NEGATIVE Final    Comment: (NOTE) SARS-CoV-2 target nucleic acids are NOT DETECTED.  The SARS-CoV-2 RNA is generally detectable in upper respiratory specimens during the acute phase of infection. The lowest concentration of SARS-CoV-2 viral copies this assay can detect is 138 copies/mL. A negative result does not preclude SARS-Cov-2 infection and should not be used as the sole basis for treatment or other patient management decisions. A negative result may occur with  improper specimen collection/handling, submission of specimen  other than nasopharyngeal swab, presence of viral mutation(s) within the areas targeted by this assay, and inadequate number of viral copies(<138 copies/mL). A negative result must be combined with clinical observations, patient history, and epidemiological information. The expected result is Negative.  Fact Sheet for Patients:  EntrepreneurPulse.com.au  Fact Sheet for Healthcare Providers:  IncredibleEmployment.be  This test is no t yet approved or cleared by the [place] FDA and  has been authorized for detection and/or diagnosis of SARS-CoV-2 by FDA under an Emergency Use Authorization (EUA). This EUA will remain  in effect (meaning this test can be used) for the duration of the COVID-19 declaration under Section 564(b)(1) of the Act, 21 U.S.C.section 360bbb-3(b)(1), unless the authorization is terminated  or revoked sooner.       Influenza A by PCR NEGATIVE NEGATIVE Final   Influenza B by PCR NEGATIVE NEGATIVE Final    Comment: (NOTE) The Xpert  Xpress SARS-CoV-2/FLU/RSV plus assay is intended as an aid in the diagnosis of influenza from Nasopharyngeal swab specimens and should not be used as a sole basis for treatment. Nasal washings and aspirates are unacceptable for Xpert Xpress SARS-CoV-2/FLU/RSV testing.  Fact Sheet for Patients: EntrepreneurPulse.com.au  Fact Sheet for Healthcare Providers: IncredibleEmployment.be  This test is not yet approved or cleared by the [place] FDA and has been authorized for detection and/or diagnosis of SARS-CoV-2 by FDA under an Emergency Use Authorization (EUA). This EUA will remain in effect (meaning this test can be used) for the duration of the COVID-19 declaration under Section 564(b)(1) of the Act, 21 U.S.C. section 360bbb-3(b)(1), unless the authorization is terminated or revoked.  Performed at [facility], [address] [place], [place] [address]          Radiology Studies: ECHOCARDIOGRAM COMPLETE  Result Date: 12/22/2021    ECHOCARDIOGRAM REPORT   Patient Name:   [facility] Date of Exam: 12/22/2021 Medical Rec #:  [id number]      Height:       62.0 in Accession #:    [id number]     Weight:       105.2 lb Date of Birth:  1918/10/07     BSA:          1.455 m Patient Age:    89 years      BP:           128/60 mmHg Patient Gender: F              HR:           84 bpm. Exam Location:  Inpatient Procedure: 2D Echo, Cardiac Doppler and Color Doppler Indications:    Elevated troponin  History:        Patient has prior history of Echocardiogram examinations, most                 recent 12/11/2017. CAD; Risk Factors:Hypertension, Diabetes and                 Dyslipidemia.  Sonographer:    Jefferey Pica Referring Phys: Stevens  1. Left ventricular ejection fraction, by estimation, is 50 to 55%. The left ventricle has low normal function. The left ventricle demonstrates regional wall motion  abnormalities (see scoring diagram/findings for description). Left ventricular diastolic  parameters are consistent with Grade I diastolic dysfunction (impaired relaxation).  2. Right ventricular systolic function is normal. The right ventricular size is normal. There is moderately elevated pulmonary artery systolic pressure. The estimated  right ventricular systolic pressure is 59.0 mmHg.  3. Left atrial size was mild to moderately dilated.  4. The mitral valve is abnormal. Mild to moderate mitral valve regurgitation.  5. The aortic valve is tricuspid. There is moderate to severe calcification of the aortic valve. Aortic valve regurgitation is trivial. Moderate to severe aortic valve stenosis, upper end of scale. Aortic valve mean gradient measures 31.0 mmHg. Aortic valve Vmax measures 3.45 m/s. Dimentionless index 0.25.  6. The inferior vena cava is normal in size with <50% respiratory variability, suggesting right atrial pressure of 8 mmHg. Comparison(s): Prior images unable to be directly viewed, comparison made by report only. FINDINGS  Left Ventricle: Left ventricular ejection fraction, by estimation, is 50 to 55%. The left ventricle has low normal function. The left ventricle demonstrates regional wall motion abnormalities. The left ventricular internal cavity size was normal in size. There is borderline left ventricular hypertrophy. Left ventricular diastolic parameters are consistent with Grade I diastolic dysfunction (impaired relaxation).  LV Wall Scoring: The mid and distal anterior septum, apical anterior segment, apical inferior segment, and apex are hypokinetic. The anterior wall, entire lateral wall, inferior wall, basal anteroseptal segment, mid inferoseptal segment, and basal inferoseptal segment are normal. Right Ventricle: The right ventricular size is normal. No increase in right ventricular wall thickness. Right ventricular systolic function is normal. There is moderately elevated pulmonary  artery systolic pressure. The tricuspid regurgitant velocity is 3.57 m/s, and with an assumed right atrial pressure of 8 mmHg, the estimated right ventricular systolic pressure is 59.0 mmHg. Left Atrium: Left atrial size was mild to moderately dilated. Right Atrium: Right atrial size was normal in size. Pericardium: There is no evidence of pericardial effusion. Mitral Valve: The mitral valve is abnormal. There is mild calcification of the mitral valve leaflet(s). Mild mitral annular calcification. Mild to moderate mitral valve regurgitation. Tricuspid Valve: The tricuspid valve is grossly normal. Tricuspid valve regurgitation is mild. Aortic Valve: The aortic valve is tricuspid. There is moderate calcification of the aortic valve. Aortic valve regurgitation is trivial. Moderate to severe aortic stenosis is present. Aortic valve mean gradient measures 31.0 mmHg. Aortic valve peak gradient measures 47.5 mmHg. Aortic valve area, by VTI measures 0.71 cm. Pulmonic Valve: The pulmonic valve was not well visualized. Pulmonic valve regurgitation is trivial. Aorta: The aortic root is normal in size and structure. Venous: The inferior vena cava is normal in size with less than 50% respiratory variability, suggesting right atrial pressure of 8 mmHg. IAS/Shunts: No atrial level shunt detected by color flow Doppler.  LEFT VENTRICLE PLAX 2D LVIDd:         4.30 cm LVIDs:         3.00 cm LV PW:         1.00 cm LV IVS:        1.00 cm LVOT diam:     1.90 cm LV SV:         50 LV SV Index:   35 LVOT Area:     2.84 cm  LV Volumes (MOD) LV vol d, MOD A4C: 103.0 ml LV vol s, MOD A4C: 53.4 ml LV SV MOD A4C:     103.0 ml RIGHT VENTRICLE          IVC RV Basal diam:  2.70 cm  IVC diam: 2.00 cm TAPSE (M-mode): 2.5 cm LEFT ATRIUM             Index        RIGHT ATRIUM  Index LA diam:        3.30 cm 2.27 cm/m   RA Area:     16.00 cm LA Vol (A2C):   69.6 ml 47.85 ml/m  RA Volume:   39.00 ml  26.81 ml/m LA Vol (A4C):   59.0 ml 40.56  ml/m LA Biplane Vol: 66.1 ml 45.44 ml/m  AORTIC VALVE AV Area (Vmax):    0.83 cm AV Area (Vmean):   0.71 cm AV Area (VTI):     0.71 cm AV Vmax:           344.67 cm/s AV Vmean:          239.667 cm/s AV VTI:            0.715 m AV Peak Grad:      47.5 mmHg AV Mean Grad:      31.0 mmHg LVOT Vmax:         101.00 cm/s LVOT Vmean:        60.200 cm/s LVOT VTI:          0.178 m LVOT/AV VTI ratio: 0.25  AORTA Ao Root diam: 3.20 cm Ao Asc diam:  3.10 cm TRICUSPID VALVE TR Peak grad:   51.0 mmHg TR Vmax:        357.00 cm/s  SHUNTS Systemic VTI:  0.18 m Systemic Diam: 1.90 cm Nona Dell MD Electronically signed by Nona Dell MD Signature Date/Time: 12/22/2021/11:56:11 AM    Final    DG Abd 1 View  Result Date: 12/21/2021 CLINICAL DATA:  Distension possible sepsis EXAM: ABDOMEN - 1 VIEW COMPARISON:  11/27/2017 FINDINGS: Overall decreased bowel gas with some scattered colon gas in the right lower quadrant. No radiopaque calculi. Hardware in the left hip. IMPRESSION: Overall nonobstructed gas pattern. Electronically Signed   By: Jasmine Pang M.D.   On: 12/21/2021 19:18   DG Chest Port 1 View  Result Date: 12/21/2021 CLINICAL DATA:  Possible sepsis. EXAM: PORTABLE CHEST 1 VIEW COMPARISON:  07/13/2021. FINDINGS: Cardiac silhouette is mildly enlarged. No mediastinal or hilar masses. Lungs are clear.  No convincing pleural effusion.  No pneumothorax. Skeletal structures are demineralized, grossly intact. IMPRESSION: No acute cardiopulmonary disease. Electronically Signed   By: Amie Portland M.D.   On: 12/21/2021 16:08        Scheduled Meds:  insulin aspart  0-9 Units Subcutaneous Q4H   pantoprazole  40 mg Oral Daily   Continuous Infusions:  cefTRIAXone (ROCEPHIN)  IV       LOS: 0 days    Time spent: 51 minutes spent on chart review, discussion with nursing staff, consultants, updating family and interview/physical exam; more than 50% of that time was spent in counseling and/or coordination of  care.    Alvira Philips [place], DO [facility] Available via Epic secure chat 7am-7pm After these hours, please refer to coverage provider listed on [email or web address] 12/22/2021, 11:59 AM

## 2021-12-22 NOTE — Progress Notes (Signed)
PHARMACY - PHYSICIAN COMMUNICATION CRITICAL VALUE ALERT - BLOOD CULTURE IDENTIFICATION (BCID)  Anne Miller is an 88 y.o. female who presented to [facility] on 12/21/2021 with a chief complaint of AMS, decreased oral intake, FTT.  Assessment:  1 of 4 blood cultures E.coli, presumed urinary source  Name of physician (or Provider) Contacted: Dr. [place]  Current antibiotics: cefepime  Changes to prescribed antibiotics recommended:  Recommendations accepted by provider - narrow to ceftriaxone 2g IV q24h  Results for orders placed or performed during the hospital encounter of 12/21/21  Blood Culture ID Panel (Reflexed) (Collected: 12/21/2021  3:04 PM)  Result Value Ref Range   Enterococcus faecalis NOT DETECTED NOT DETECTED   Enterococcus Faecium NOT DETECTED NOT DETECTED   Listeria monocytogenes NOT DETECTED NOT DETECTED   Staphylococcus species NOT DETECTED NOT DETECTED   Staphylococcus aureus (BCID) NOT DETECTED NOT DETECTED   Staphylococcus epidermidis NOT DETECTED NOT DETECTED   Staphylococcus lugdunensis NOT DETECTED NOT DETECTED   Streptococcus species NOT DETECTED NOT DETECTED   Streptococcus agalactiae NOT DETECTED NOT DETECTED   Streptococcus pneumoniae NOT DETECTED NOT DETECTED   Streptococcus pyogenes NOT DETECTED NOT DETECTED   A.calcoaceticus-baumannii NOT DETECTED NOT DETECTED   Bacteroides fragilis NOT DETECTED NOT DETECTED   Enterobacterales DETECTED (A) NOT DETECTED   Enterobacter cloacae complex NOT DETECTED NOT DETECTED   Escherichia coli DETECTED (A) NOT DETECTED   Klebsiella aerogenes NOT DETECTED NOT DETECTED   Klebsiella oxytoca NOT DETECTED NOT DETECTED   Klebsiella pneumoniae NOT DETECTED NOT DETECTED   Proteus species NOT DETECTED NOT DETECTED   Salmonella species NOT DETECTED NOT DETECTED   Serratia marcescens NOT DETECTED NOT DETECTED   Haemophilus influenzae NOT DETECTED NOT DETECTED   Neisseria meningitidis NOT DETECTED NOT DETECTED   Pseudomonas  aeruginosa NOT DETECTED NOT DETECTED   Stenotrophomonas maltophilia NOT DETECTED NOT DETECTED   Candida albicans NOT DETECTED NOT DETECTED   Candida auris NOT DETECTED NOT DETECTED   Candida glabrata NOT DETECTED NOT DETECTED   Candida krusei NOT DETECTED NOT DETECTED   Candida parapsilosis NOT DETECTED NOT DETECTED   Candida tropicalis NOT DETECTED NOT DETECTED   Cryptococcus neoformans/gattii NOT DETECTED NOT DETECTED   CTX-M ESBL NOT DETECTED NOT DETECTED   Carbapenem resistance IMP NOT DETECTED NOT DETECTED   Carbapenem resistance KPC NOT DETECTED NOT DETECTED   Carbapenem resistance NDM NOT DETECTED NOT DETECTED   Carbapenem resist OXA 48 LIKE NOT DETECTED NOT DETECTED   Carbapenem resistance VIM NOT DETECTED NOT DETECTED    Peggyann Juba, PharmD, BCPS Pharmacy: [phone] 12/22/2021  6:30 PM

## 2021-12-22 NOTE — Progress Notes (Signed)
PT Cancellation Note  Patient Details Name: Anne Miller MRN: [id number] DOB: April 05, 1918   Cancelled Treatment:     PT eval reattempted but deferred - RN advises pt was awake at lunch time, pt now sleeping soundly and not rousing to verbal or tactile stimuli.  Will follow.   Monai Hindes 12/22/2021, 4:08 PM

## 2021-12-22 NOTE — Plan of Care (Signed)
Spoke with son about plan of care  Problem: Education: Goal: Knowledge of General Education information will improve Description: Including pain rating scale, medication(s)/side effects and non-pharmacologic comfort measures Outcome: Progressing   Problem: Health Behavior/Discharge Planning: Goal: Ability to manage health-related needs will improve Outcome: Progressing   Problem: Clinical Measurements: Goal: Ability to maintain clinical measurements within normal limits will improve Outcome: Progressing Goal: Will remain free from infection Outcome: Progressing Goal: Diagnostic test results will improve Outcome: Progressing Goal: Respiratory complications will improve Outcome: Progressing Goal: Cardiovascular complication will be avoided Outcome: Progressing   Problem: Activity: Goal: Risk for activity intolerance will decrease Outcome: Progressing   Problem: Nutrition: Goal: Adequate nutrition will be maintained Outcome: Progressing   Problem: Coping: Goal: Level of anxiety will decrease Outcome: Progressing   Problem: Elimination: Goal: Will not experience complications related to bowel motility Outcome: Progressing Goal: Will not experience complications related to urinary retention Outcome: Progressing   Problem: Pain Managment: Goal: General experience of comfort will improve Outcome: Progressing   Problem: Safety: Goal: Ability to remain free from injury will improve Outcome: Progressing   Problem: Skin Integrity: Goal: Risk for impaired skin integrity will decrease Outcome: Progressing   Problem: Fluid Volume: Goal: Hemodynamic stability will improve Outcome: Progressing   Problem: Clinical Measurements: Goal: Diagnostic test results will improve Outcome: Progressing Goal: Signs and symptoms of infection will decrease Outcome: Progressing   Problem: Respiratory: Goal: Ability to maintain adequate ventilation will improve Outcome: Progressing    Problem: Education: Goal: Ability to describe self-care measures that may prevent or decrease complications (Diabetes Survival Skills Education) will improve Outcome: Progressing Goal: Individualized Educational Video(s) Outcome: Progressing   Problem: Coping: Goal: Ability to adjust to condition or change in health will improve Outcome: Progressing   Problem: Fluid Volume: Goal: Ability to maintain a balanced intake and output will improve Outcome: Progressing   Problem: Health Behavior/Discharge Planning: Goal: Ability to identify and utilize available resources and services will improve Outcome: Progressing Goal: Ability to manage health-related needs will improve Outcome: Progressing   Problem: Metabolic: Goal: Ability to maintain appropriate glucose levels will improve Outcome: Progressing   Problem: Nutritional: Goal: Maintenance of adequate nutrition will improve Outcome: Progressing Goal: Progress toward achieving an optimal weight will improve Outcome: Progressing   Problem: Skin Integrity: Goal: Risk for impaired skin integrity will decrease Outcome: Progressing   Problem: Tissue Perfusion: Goal: Adequacy of tissue perfusion will improve Outcome: Progressing

## 2021-12-22 NOTE — Evaluation (Addendum)
Clinical/Bedside Swallow Evaluation Patient Details  Name: Anne Miller MRN: [id number] Date of Birth: [birth year]  Today's Date: 12/22/2021 Time: SLP Start Time (ACUTE ONLY): 1104 SLP Stop Time (ACUTE ONLY): 1131 SLP Time Calculation (min) (ACUTE ONLY): 27 min  Past Medical History:  Past Medical History:  Diagnosis Date   Coronary artery disease    Dementia ([place])    Diabetes mellitus without complication ([place])    GERD (gastroesophageal reflux disease)    Hearing loss    Hyperlipidemia    Hypertension    Past Surgical History: History reviewed. No pertinent surgical history. HPI:  Pt is a 88 y.o. female who presented with change in mental status. CXR on admission negative and pt dx with UTI. PMH: dementia hypertension, GERD, HLD, CAD, type 2 diabetes. BSE 12/11/17: functional oropharyngeal swallow; rx regular/thin.    Assessment / Plan / Recommendation  Clinical Impression  Pt was seen for bedside swallow evaluation with her son present. Pt's son reported that the pt has been on thickened liquids for the past 90-120 days and that she has been on a puree diet for 4-6 months. Pt's son expressed his concern with the thickened liquids potentially leading to dehydration and stated that he has noticed a 20lb weight loss since pt was started on the puree diet. To the son's knowledge, an instrumental assessment had not been completed prior to diet modification. Pt exhibited symptoms of oropharyngeal dysphagia characterized by prolonged mastication, reduced bolus awareness, oral holding, and signs of aspiration with thin liquids, and with consecutive swallows of nectar thick liquids. Pt's performance was discussed at length with the pt's son in view of his goal for her to be off of puree and thickened liquids. It was agreed that a modified barium swallow study will be conducted to further evaluate swallow physiology and to assess potential for diet advancement with consideration the family's goals  and dysphagia-related adverse events. It was agreed that a dysphagia 2 diet with nectar thick liquids will be initiated until instrumental assessment is completed. SLP Visit Diagnosis: Dysphagia, unspecified (R13.10)    Aspiration Risk  Mild aspiration risk;Moderate aspiration risk    Diet Recommendation Dysphagia 2 (Fine chop);Nectar-thick liquid   Liquid Administration via: Cup;Straw Medication Administration: Whole meds with puree Supervision: Full supervision/cueing for compensatory strategies Compensations: Slow rate;Small sips/bites;Minimize environmental distractions (avoid consecutive swallows) Postural Changes: Seated upright at 90 degrees    Other  Recommendations Oral Care Recommendations: Oral care BID Other Recommendations: Order thickener from pharmacy    Recommendations for follow up therapy are one component of a multi-disciplinary discharge planning process, led by the attending physician.  Recommendations may be updated based on patient status, additional functional criteria and insurance authorization.  Follow up Recommendations Skilled nursing-short term rehab (<3 hours/day)      Assistance Recommended at Discharge Frequent or constant Supervision/Assistance  Functional Status Assessment Patient has had a recent decline in their functional status and demonstrates the ability to make significant improvements in function in a reasonable and predictable amount of time.  Frequency and Duration min 2x/week  2 weeks       Prognosis Prognosis for Safe Diet Advancement: Good Barriers to Reach Goals: Cognitive deficits;Time post onset      Swallow Study   General Date of Onset: 12/21/21 HPI: Pt is a 88 y.o. female who presented with change in mental status. CXR on admission negative and pt dx with UTI. PMH: dementia hypertension, GERD, HLD, CAD, type 2 diabetes. BSE 12/11/17: functional oropharyngeal swallow;  rx regular/thin. Type of Study: Bedside Swallow  Evaluation Previous Swallow Assessment: none Diet Prior to this Study: Regular;Thin liquids Temperature Spikes Noted: No Respiratory Status: Room air History of Recent Intubation: No Behavior/Cognition: Alert;Cooperative;Pleasant mood Oral Cavity Assessment: Within Functional Limits Oral Care Completed by SLP: No Oral Cavity - Dentition: Adequate natural dentition Self-Feeding Abilities: Total assist Patient Positioning: Upright in bed Baseline Vocal Quality: Low vocal intensity Volitional Cough: Weak Volitional Swallow: Unable to elicit    Oral/Motor/Sensory Function Overall Oral Motor/Sensory Function:  (UTA)   Ice Chips Ice chips: Within functional limits Presentation: Spoon   Thin Liquid Thin Liquid: Impaired Presentation: Cup;Straw Oral Phase Impairments: Poor awareness of bolus Oral Phase Functional Implications: Oral holding Pharyngeal  Phase Impairments: Cough - Immediate;Cough - Delayed (lessened, but not eliminated with cup sip)    Nectar Thick Nectar Thick Liquid: Impaired Presentation: Straw Oral Phase Impairments: Poor awareness of bolus Oral phase functional implications: Oral holding Pharyngeal Phase Impairments: Cough - Delayed (with consecutive swallows)   Honey Thick Honey Thick Liquid: Not tested   Puree Puree: Impaired Presentation: Spoon Oral Phase Functional Implications: Oral holding   Solid     Solid: Impaired Presentation: Spoon Oral Phase Impairments: Impaired mastication Oral Phase Functional Implications: Impaired mastication     Paeton Studer I. Vear Clock, MS, CCC-SLP Acute Rehabilitation Services Office number [phone]  Scheryl Marten 12/22/2021,11:57 AM

## 2021-12-22 NOTE — Consult Note (Signed)
[facility] Nurse Consult Note: Reason for Consult:Skin tear on left pretibial area Wound type:trauma Pressure Injury:  NA Measurement:4cm x 2cm x 0.1cm per Bedside RN Jani Gravel this shift and documented on Nursing Flow Sheet Wound bed:red, moist Drainage (amount, consistency, odor) scant serosanguinous Periwound:intact Dressing procedure/placement/frequency:I have provided guidance for the care of this skin injury with atraumatic dressings of antimicrobial nonadherent (Xeroform) gauze topped with dry gauze, an ABD for cushioning and secured with a few turns of Kerlix roll gauze/paper tape changed daily. This is to be performed following a NS cleanse and gentle pat dry. A sacral silicone foam bordered dressing is to be placed for PI prevention and the patient turned and repositioned to minimize time in the supine position. Her bilateral heels are to be floated.  POC discussed with Dr. Roel Cluck and the Bedside RN via [place].  [facility] nursing team will not follow, but will remain available to this patient, the nursing and medical teams.  Please re-consult if needed.  Thank you for inviting [place] to participate in this patient's Plan of Care.  Maudie Flakes, MSN, RN, CNS, [place], Serita Grammes, [employer], [employer] phone:  [phone]

## 2021-12-22 NOTE — Plan of Care (Signed)
  Problem: Education: Goal: Knowledge of General Education information will improve Description: Including pain rating scale, medication(s)/side effects and non-pharmacologic comfort measures Outcome: Progressing   Problem: Health Behavior/Discharge Planning: Goal: Ability to manage health-related needs will improve Outcome: Progressing   Problem: Clinical Measurements: Goal: Ability to maintain clinical measurements within normal limits will improve Outcome: Progressing Goal: Will remain free from infection Outcome: Progressing Goal: Diagnostic test results will improve Outcome: Progressing Goal: Respiratory complications will improve Outcome: Progressing Goal: Cardiovascular complication will be avoided Outcome: Progressing   Problem: Activity: Goal: Risk for activity intolerance will decrease Outcome: Progressing   Problem: Nutrition: Goal: Adequate nutrition will be maintained Outcome: Progressing   Problem: Coping: Goal: Level of anxiety will decrease Outcome: Progressing   Problem: Elimination: Goal: Will not experience complications related to bowel motility Outcome: Progressing Goal: Will not experience complications related to urinary retention Outcome: Progressing   Problem: Pain Managment: Goal: General experience of comfort will improve Outcome: Progressing   Problem: Safety: Goal: Ability to remain free from injury will improve Outcome: Progressing   Problem: Skin Integrity: Goal: Risk for impaired skin integrity will decrease Outcome: Progressing   Problem: Fluid Volume: Goal: Hemodynamic stability will improve Outcome: Progressing   Problem: Clinical Measurements: Goal: Diagnostic test results will improve Outcome: Progressing Goal: Signs and symptoms of infection will decrease Outcome: Progressing   Problem: Respiratory: Goal: Ability to maintain adequate ventilation will improve Outcome: Progressing   Problem: Education: Goal: Ability  to describe self-care measures that may prevent or decrease complications (Diabetes Survival Skills Education) will improve Outcome: Progressing Goal: Individualized Educational Video(s) Outcome: Progressing   Problem: Coping: Goal: Ability to adjust to condition or change in health will improve Outcome: Progressing   Problem: Fluid Volume: Goal: Ability to maintain a balanced intake and output will improve Outcome: Progressing   Problem: Health Behavior/Discharge Planning: Goal: Ability to identify and utilize available resources and services will improve Outcome: Progressing Goal: Ability to manage health-related needs will improve Outcome: Progressing   Problem: Metabolic: Goal: Ability to maintain appropriate glucose levels will improve Outcome: Progressing   Problem: Nutritional: Goal: Maintenance of adequate nutrition will improve Outcome: Progressing Goal: Progress toward achieving an optimal weight will improve Outcome: Progressing   Problem: Skin Integrity: Goal: Risk for impaired skin integrity will decrease Outcome: Progressing   Problem: Tissue Perfusion: Goal: Adequacy of tissue perfusion will improve Outcome: Progressing   Spoke with son about plan of care and progress.

## 2021-12-23 ENCOUNTER — Inpatient Hospital Stay (HOSPITAL_COMMUNITY): Payer: Medicare Other

## 2021-12-23 DIAGNOSIS — A4150 Gram-negative sepsis, unspecified: Secondary | ICD-10-CM | POA: Diagnosis not present

## 2021-12-23 LAB — CBC
HCT: 36.2 % (ref 36.0–46.0)
Hemoglobin: 11.6 g/dL — ABNORMAL LOW (ref 12.0–15.0)
MCH: 30.7 pg (ref 26.0–34.0)
MCHC: 32 g/dL (ref 30.0–36.0)
MCV: 95.8 fL (ref 80.0–100.0)
Platelets: 176 10*3/uL (ref 150–400)
RBC: 3.78 MIL/uL — ABNORMAL LOW (ref 3.87–5.11)
RDW: 13.3 % (ref 11.5–15.5)
WBC: 7.2 10*3/uL (ref 4.0–10.5)
nRBC: 0 % (ref 0.0–0.2)

## 2021-12-23 LAB — BASIC METABOLIC PANEL
Anion gap: 5 (ref 5–15)
BUN: 12 mg/dL (ref 8–23)
CO2: 25 mmol/L (ref 22–32)
Calcium: 8.3 mg/dL — ABNORMAL LOW (ref 8.9–10.3)
Chloride: 106 mmol/L (ref 98–111)
Creatinine, Ser: 0.57 mg/dL (ref 0.44–1.00)
GFR, Estimated: >60 mL/min (ref >60)
Glucose, Bld: 100 mg/dL — ABNORMAL HIGH (ref 70–99)
Potassium: 4.1 mmol/L (ref 3.5–5.1)
Sodium: 136 mmol/L (ref 135–145)

## 2021-12-23 LAB — GLUCOSE, CAPILLARY
Glucose-Capillary: 117 mg/dL — ABNORMAL HIGH (ref 70–99)
Glucose-Capillary: 139 mg/dL — ABNORMAL HIGH (ref 70–99)
Glucose-Capillary: 90 mg/dL (ref 70–99)
Glucose-Capillary: 96 mg/dL (ref 70–99)
Glucose-Capillary: 99 mg/dL (ref 70–99)
Glucose-Capillary: 99 mg/dL (ref 70–99)

## 2021-12-23 LAB — PHOSPHORUS: Phosphorus: 2.3 mg/dL — ABNORMAL LOW (ref 2.5–4.6)

## 2021-12-23 LAB — MAGNESIUM: Magnesium: 1.9 mg/dL (ref 1.7–2.4)

## 2021-12-23 MED ORDER — ADULT MULTIVITAMIN W/MINERALS CH
1.0000 | ORAL_TABLET | Freq: Every day | ORAL | Status: DC
  Administered 2021-12-23 – 2021-12-25 (×3): 1 via ORAL
  Filled 2021-12-23 (×4): qty 1

## 2021-12-23 MED ORDER — ENSURE ENLIVE PO LIQD
237.0000 mL | Freq: Three times a day (TID) | ORAL | Status: DC
  Administered 2021-12-23 – 2021-12-25 (×4): 237 mL via ORAL

## 2021-12-23 NOTE — Progress Notes (Signed)
Speech Language Pathology Treatment: Dysphagia  Patient Details Name: Anne Miller MRN: [id number] DOB: [birth year] Today's Date: 12/23/2021 Time: 2707-8675 SLP Time Calculation (min) (ACUTE ONLY): 35 min  Assessment / Plan / Recommendation Clinical Impression  Patient seen by SLP for skilled treatment focused on dysphagia goals. SLP had recently finished patient's MBS and not plan is to observe her with her breakfast meal tray (ordered and sent prior to MBS and diet change, so was Dys 2, nectar thick). SLP discarded nectar thick juice and thickened up juices to honey thick consistency. Patient was receptive towards eating and drinking and after SLP feeding her for a while, she started to feed herself via spoon and drinks via cup sips. She exhibited prolonged mastication with Dys 2 solids (eggs and minced sausage patty) as well as delayed coughing and throat clearing and so SLP then only focused on the puree solids on tray (grits and applesauce). No immediate coughing or throat clearing with puree solids or spoon/cup sips of honey thick liquids, however patient did have 2-3 instances of delayed coughing with audible secretions that she was not able to clear. When speaking she did have a congested sounding voice at times. SLP will continue to follow patient for diet toleration. Potential for upgrade will largely depend upon Anne Miller.   HPI HPI: Pt is a 88 y.o. female who presented with change in mental status. CXR on admission negative and pt dx with UTI. PMH: dementia hypertension, GERD, HLD, CAD, type 2 diabetes. BSE 12/11/17: functional oropharyngeal swallow; rx regular/thin.      SLP Plan  Continue with current plan of care      Recommendations for follow up therapy are one component of a multi-disciplinary discharge planning process, led by the attending physician.  Recommendations may be updated based on patient status, additional functional criteria and insurance authorization.     Recommendations  Diet recommendations: Dysphagia 1 (puree);Honey-thick liquid Liquids provided via: Cup;No straw;Teaspoon Medication Administration: Crushed with puree Supervision: Full supervision/cueing for compensatory strategies;Staff to assist with self feeding;Patient able to self feed Compensations: Slow rate;Small sips/bites;Minimize environmental distractions Postural Changes and/or Swallow Maneuvers: Seated upright 90 degrees;Upright 30-60 min after meal                Oral Care Recommendations: Oral care BID;Staff/trained caregiver to provide oral care Follow Up Recommendations: Skilled nursing-short term rehab (<3 hours/day) Assistance recommended at discharge: Frequent or constant Supervision/Assistance SLP Visit Diagnosis: Dysphagia, oropharyngeal phase (R13.12) Plan: Continue with current plan of care         Sonia Baller, MA, CCC-SLP Speech Therapy

## 2021-12-23 NOTE — Evaluation (Signed)
Physical Therapy Evaluation Patient Details Name: Anne Miller MRN: [id number] DOB: 08-12-1918 Today's Date: 12/23/2021  History of Present Illness  [name] is a 88 y.o. female who presented with change in mental status. CXR on admission negative and pt dx with UTI. PMH: dementia hypertension, GERD, HLD, CAD, type 2 diabetes. BSE 12/11/17: functional oropharyngeal swallow; rx regular/thin, advanced Alzheimer's disease.   Clinical Impression  Anne Miller is 88 y.o. female admitted with above HPI and diagnosis. Patient is currently limited by functional impairments below (see PT problem list). Patient is at resident at [facility], PTA she was mobilizing bed<>wheelchair with assist, unsure if pt was self propelling with UE's/LE's for wheelchair mobility. Today pt required +2 Max/Total assist for bed mobility and stand pivot transfer bed>chair. Patient will benefit from continued skilled PT interventions to address impairments and progress independence with mobility, recommending pt return to SNF for continued LTC with 24/7 assist from staff. Acute PT will follow and progress as able.        Recommendations for follow up therapy are one component of a multi-disciplinary discharge planning process, led by the attending physician.  Recommendations may be updated based on patient status, additional functional criteria and insurance authorization.  Follow Up Recommendations Skilled nursing-short term rehab (<3 hours/day) (return to [facility]) Can patient physically be transported by private vehicle: No    Assistance Recommended at Discharge Frequent or constant Supervision/Assistance  Patient can return home with the following  Two people to help with walking and/or transfers;Two people to help with bathing/dressing/bathroom;Assistance with feeding;Assistance with cooking/housework;Direct supervision/assist for medications management;Assist for transportation;Help with stairs or ramp for  entrance    Equipment Recommendations None recommended by PT  Recommendations for Other Services       Functional Status Assessment Patient has had a recent decline in their functional status and/or demonstrates limited ability to make significant improvements in function in a reasonable and predictable amount of time     Precautions / Restrictions Precautions Precautions: Fall Restrictions Weight Bearing Restrictions: No      Mobility  Bed Mobility Overal bed mobility: Needs Assistance Bed Mobility: Supine to Sit                Transfers Overall transfer level: Needs assistance Equipment used: 2 person hand held assist Transfers: Sit to/from Stand, Bed to chair/wheelchair/BSC Sit to Stand: +2 safety/equipment, Total assist, +2 physical assistance, Max assist Stand pivot transfers: Max assist, +2 physical assistance, +2 safety/equipment, Total assist         General transfer comment: Max/Total +2 for stand from EOB with pivot to recliner. use of bed pad to lift/initiate rise at hips and PT/OT blocking knees and guiding feet/hips to turn to recliner.    Ambulation/Gait                  Stairs            Wheelchair Mobility    Modified Rankin (Stroke Patients Only)       Balance Overall balance assessment: Needs assistance Sitting-balance support: Feet supported, Bilateral upper extremity supported Sitting balance-Leahy Scale: Poor Sitting balance - Comments: pt required assist to prevent posterior and Lt lean constantly.   Standing balance support: Bilateral upper extremity supported Standing balance-Leahy Scale: Zero Standing balance comment: Max/Total +2                             Pertinent Vitals/Pain Pain Assessment  Pain Assessment: PAINAD Breathing: normal Negative Vocalization: none Facial Expression: smiling or inexpressive Body Language: relaxed Consolability: no need to console PAINAD Score: 0 Pain  Intervention(s): Monitored during session, Repositioned    Home Living Family/patient expects to be discharged to:: Skilled nursing facility                   Additional Comments: Advanced Alzheimers--total care for all basic ADLs and mobility    Prior Function Prior Level of Function : Needs assist  Cognitive Assist : Mobility (cognitive);ADLs (cognitive) Mobility (Cognitive): Step by step cues ADLs (Cognitive): Step by step cues Physical Assist : Mobility (physical);ADLs (physical) Mobility (physical): Bed mobility;Transfers ADLs (physical): Feeding;Bathing;Grooming;Dressing;Toileting Mobility Comments: total A +2 ADLs Comments: total A at bed level     Hand Dominance   Dominant Hand:  (unknown)    Extremity/Trunk Assessment   Upper Extremity Assessment Upper Extremity Assessment: Defer to OT evaluation;Generalized weakness    Lower Extremity Assessment Lower Extremity Assessment: Generalized weakness    Cervical / Trunk Assessment Cervical / Trunk Assessment: Kyphotic  Communication   Communication: HOH (hearing aid right ear; does read (delayed))  Cognition Arousal/Alertness: Awake/alert Behavior During Therapy:  (smiles when spoken too) Overall Cognitive Status: History of cognitive impairments - at baseline                                          General Comments      Exercises     Assessment/Plan    PT Assessment Patient needs continued PT services  PT Problem List Decreased strength;Decreased range of motion;Decreased activity tolerance;Decreased balance;Decreased mobility;Decreased knowledge of use of DME;Decreased knowledge of precautions       PT Treatment Interventions DME instruction;Functional mobility training;Therapeutic activities;Therapeutic exercise;Balance training;Patient/family education;Wheelchair mobility training    PT Goals (Current goals can be found in the Care Plan section)  Acute Rehab PT Goals Patient  Stated Goal: pt's son states to get her back to [facility] PT Goal Formulation: Patient unable to participate in goal setting Time For Goal Achievement: 01/06/22 Potential to Achieve Goals: Fair    Frequency Min 2X/week     Co-evaluation PT/OT/SLP Co-Evaluation/Treatment: Yes Reason for Co-Treatment: For patient/therapist safety;To address functional/ADL transfers;Necessary to address cognition/behavior during functional activity PT goals addressed during session: Mobility/safety with mobility;Balance OT goals addressed during session: ADL's and self-care;Strengthening/ROM       AM-PAC PT "6 Clicks" Mobility  Outcome Measure Help needed turning from your back to your side while in a flat bed without using bedrails?: Total Help needed moving from lying on your back to sitting on the side of a flat bed without using bedrails?: Total Help needed moving to and from a bed to a chair (including a wheelchair)?: Total Help needed standing up from a chair using your arms (e.g., wheelchair or bedside chair)?: Total Help needed to walk in hospital room?: Total Help needed climbing 3-5 steps with a railing? : Total 6 Click Score: 6    End of Session Equipment Utilized During Treatment: Gait belt Activity Tolerance: Patient tolerated treatment well Patient left: in chair;with call bell/phone within reach;with chair alarm set Nurse Communication: Mobility status PT Visit Diagnosis: Unsteadiness on feet (R26.81);Muscle weakness (generalized) (M62.81);Difficulty in walking, not elsewhere classified (R26.2)    Time: [id number] PT Time Calculation (min) (ACUTE ONLY): 34 min   Charges:   PT Evaluation $PT Eval Low Complexity:  [place], [place] Office [phone]  12/23/21 12:15 PM

## 2021-12-23 NOTE — Progress Notes (Signed)
Modified Barium Swallow Progress Note  Patient Details  Name: Anne Miller MRN: [id number] Date of Birth: [birth year]  Today's Date: 12/23/2021  Modified Barium Swallow completed.  Full report located under Chart Review in the Imaging Section.  Brief recommendations include the following:  Clinical Impression  Patient presents with a mild oral and a moderate pharyngeal phase dysphagia. Bolus consistencies tested during today's MBS were: puree/pudding thick, honey thick, nectar thick, thin.  During oral phase, she exhibited delayed anterior to posterior transit of liquids and solids, leading to premature spillage of puree solids and honey thick liquids into laryngeal vestibule. No significant delays in swallow initiation observed with any of the tested bolus consistencies.With thin liquids via spoon sip she exhibited penetration during the swallow which did not clear laryngeal vestibule and did lead to eventual aspiration. With thin liquids via cup sips, she exhibited consistent aspiration during the swallow as well as aspiration after the swallow from residuals still in laryngeal vestibule. With nectar thick liquids via spoon sip she exhibited trace penetration which did not clear and with cup sips of nectar thick liquids, she exhibited penetration during the swallow which led to aspiration of penetrates after the swallow. With honey thick liquids via spoon sip, no penetration or aspiration occured and no pharyngeal residuals. With small controlled cup sips, patient did have one incident of trace penetration which did not immediately clear, but eventually cleared laryngeal vestibule without any observed aspiration.Trace residuals observed in vallecular sinus, pyriform sinus s/p initial swallow of thin liquids but with honey thick, nectar thick and puree consistencies, no signficant amount of pharyngeal residuals observed. SLP observed patient to have instance of throat clearing and audible secretions  when flurou was turned off but when turned back on, no observed barium contrast in pharyngeal or laryngeal areas. Suspect she patient was having penetration, possibly aspiration of her secretions. Patient is unable to follow any directions so no swallow strategies attempted. SLP is recommending downgrade diet to Dys 1(puree) solids, honey thick liquids due to her mod-severe risk of aspiration. Depending on [place], may consider water protocol in between meals with thin liquid (only plain water) given by spoon or measured cup sip (Provale 5cc). SLP will follow for toleration, education with family and assistance in determining LRD.   Swallow Evaluation Recommendations       SLP Diet Recommendations: Dysphagia 1 (Puree) solids;Honey thick liquids   Liquid Administration via: Cup;Spoon;No straw   Medication Administration: Crushed with puree   Supervision: Full supervision/cueing for compensatory strategies;Staff to assist with self feeding;Patient able to self feed   Compensations: Slow rate;Small sips/bites;Minimize environmental distractions   Postural Changes: Seated upright at 90 degrees;Remain semi-upright after after feeds/meals (Comment) (20-30 minutes)   Oral Care Recommendations: Oral care BID;Oral care before and after PO;Staff/trained caregiver to provide oral care   Other Recommendations: Order thickener from [place], [place], [facility]

## 2021-12-23 NOTE — Plan of Care (Signed)
Problem: Clinical Measurements: Goal: Ability to maintain clinical measurements within normal limits will improve Outcome: Progressing   Problem: Nutrition: Goal: Adequate nutrition will be maintained Outcome: Progressing   Problem: Elimination: Goal: Will not experience complications related to bowel motility Outcome: [place], RN 12/23/21 8:36 AM

## 2021-12-23 NOTE — TOC Initial Note (Signed)
Transition of Care [facility]) - Initial/Assessment Note    Patient Details  Name: Anne Miller MRN: [id number] Date of Birth: 07/18/1918  [facility]) CM/SW Contact:    Vassie Moselle, LCSW Phone Number: 12/23/2021, 10:21 AM  Clinical Narrative:                 CSW met with pt and son at bedside. Pt is coming from [facility] where she is a long term resident of their SNF. CSW confirmed plan for pt to return at discharge. CSW spoke with Soy at [facility] who confirms pt is able to return and denies needing FL-2 prior to return. TOC will continue to follow for discharge planning.    Expected Discharge Plan: [place] Barriers to Discharge: No Barriers Identified   Patient Goals and CMS Choice Patient states their goals for this hospitalization and ongoing recovery are:: To return to [facility] CMS Medicare.gov Compare Post Acute Care list provided to:: Patient Choice offered to / list presented to : Patient, Adult Children  Expected Discharge Plan and Services Expected Discharge Plan: [place] In-house Referral: NA Discharge Planning Services: CM Consult Post Acute Care Choice: [place] Living arrangements for the past 2 months: [place]                 DME Arranged: N/A DME Agency: NA                  Prior Living Arrangements/Services Living arrangements for the past 2 months: [place] Lives with:: Facility Resident Patient language and need for interpreter reviewed:: Yes Do you feel safe going back to the place where you live?: Yes      Need for Family Participation in Patient Care: Yes (Comment) Care giver support system in place?: Yes (comment)   Criminal Activity/Legal Involvement Pertinent to Current Situation/Hospitalization: No - Comment as needed  Activities of Daily Living Home Assistive Devices/Equipment: Wheelchair ADL Screening (condition at time  of admission) Patient's cognitive ability adequate to safely complete daily activities?: No Is the patient deaf or have difficulty hearing?: No Does the patient have difficulty seeing, even when wearing glasses/contacts?: No Does the patient have difficulty concentrating, remembering, or making decisions?: Yes Patient able to express need for assistance with ADLs?: No Does the patient have difficulty dressing or bathing?: Yes Independently performs ADLs?: No Communication: Dependent Is this a change from baseline?: Change from baseline, expected to last <3 days Dressing (OT): Needs assistance Is this a change from baseline?: Change from baseline, expected to last <3days Grooming: Needs assistance Is this a change from baseline?: Change from baseline, expected to last <3 days Feeding: Needs assistance Is this a change from baseline?: Change from baseline, expected to last <3 days Bathing: Needs assistance Is this a change from baseline?: Pre-admission baseline Toileting: Needs assistance Is this a change from baseline?: Change from baseline, expected to last <3 days In/Out Bed: Dependent Is this a change from baseline?: Pre-admission baseline Walks in Home: Dependent Is this a change from baseline?: Pre-admission baseline Does the patient have difficulty walking or climbing stairs?: Yes Weakness of Legs: Both Weakness of Arms/Hands: Both  Permission Sought/Granted Permission sought to share information with : Facility Sport and exercise psychologist, Family Supports Permission granted to share information with : Yes, Verbal Permission Granted  Share Information with NAME: Anne Miller  Permission granted to share info w AGENCY: [facility]  Permission granted to share info  w Relationship: Son  Permission granted to share info w Contact Information: [phone]  Emotional Assessment Appearance:: Appears stated age Attitude/Demeanor/Rapport: Unresponsive Affect (typically observed): Unable  to Assess Orientation: : Oriented to Self Alcohol / Substance Use: Not Applicable Psych Involvement: No (comment)  Admission diagnosis:  UTI (urinary tract infection) [N39.0] Weakness [R53.1] Urinary tract infection without hematuria, site unspecified [N39.0] Septicemia due to Gram negative organism [facility]) [A41.50] Patient Active Problem List   Diagnosis Date Noted   Septicemia due to Gram negative organism ([place]) 12/22/2021   UTI (urinary tract infection) 12/21/2021   Elevated troponin 12/21/2021   Sepsis ([place]) 12/21/2021   Symptomatic bradycardia 07/13/2021   Sensorineural hearing loss (SNHL) of both ears 07/10/2020   Type 2 diabetes mellitus without complication ([place]) 92/42/6834   Nausea vomiting and diarrhea 12/10/2017   Hypoxia 12/10/2017   Dementia ([place]) 12/10/2017   CAP (community acquired pneumonia) 12/09/2017   Delirium 12/09/2017   Esophageal reflux 04/30/2015   Hyperlipidemia with target LDL less than 100 04/30/2015   Atherosclerotic heart disease of native coronary artery without angina pectoris 04/30/2015   Thoracic compression fracture ([place]) 04/30/2015   PCP:  Merryl Hacker, No Pharmacy:   [facility] [place] [facility] [address] [place] [address] Phone: [phone] Fax: [phone]     Social Determinants of Health (SDOH) Interventions    Readmission Risk Interventions    12/23/2021   10:19 AM  Readmission Risk Prevention Plan  Post Dischage Appt Complete  Medication Screening Complete  Transportation Screening Complete

## 2021-12-23 NOTE — Progress Notes (Signed)
Initial Nutrition Assessment  DOCUMENTATION CODES:   Severe malnutrition in context of chronic illness  INTERVENTION:   -Ensure Plus High Protein po TID, each supplement provides 350 kcal and 20 grams of protein.   -Multivitamin with minerals daily -crushed  NUTRITION DIAGNOSIS:   Severe Malnutrition related to chronic illness, dysphagia (dementia) as evidenced by severe fat depletion, severe muscle depletion.  GOAL:   Patient will meet greater than or equal to 90% of their needs  MONITOR:   PO intake, Supplement acceptance, Labs, Weight trends, I & O's  REASON FOR ASSESSMENT:   Consult Assessment of nutrition requirement/status  ASSESSMENT:   88 y.o. female with past medical history significant for dementia, nonverbal at baseline, essential hypertension, HLD, CAD, diet-controlled DM2, GERD, oropharyngeal dysphagia who presented from [facility] living facility to [facility] ED on 9/30 with altered mental status, decreased oral intake/adult failure to thrive.  Due to her nonverbal status, advanced Alzheimer's disease majority of HPI obtained from son.  Patient in room with son at bedside. Pt nonverbal but is pleasant and smiles.  Pt consuming 10-35% of meals. Underwent MBS today and SLP recommended dysphagia 1 with honey thick liquids. Pt has been sipping on honey thick juices. Pt's son is agreeable to having Ensure ordered and daily MVI.  Pt's son states she has been losing weight over the past year. Per weight records, no significant weight changes noted.  Medications reviewed.  Labs reviewed: CBGs: 90-99 Low Phos   NUTRITION - FOCUSED PHYSICAL EXAM:  Flowsheet Row Most Recent Value  Orbital Region Severe depletion  Upper Arm Region Severe depletion  Thoracic and Lumbar Region Severe depletion  Buccal Region Severe depletion  Temple Region Severe depletion  Clavicle Bone Region Severe depletion  Clavicle and Acromion Bone Region Severe depletion  Scapular Bone  Region Severe depletion  Dorsal Hand Severe depletion  Patellar Region Severe depletion  Anterior Thigh Region Severe depletion  Posterior Calf Region Severe depletion  Edema (RD Assessment) None  Hair Reviewed  [thin]  Eyes Reviewed  Mouth Reviewed  [poor dentition]  Skin Reviewed       Diet Order:   Diet Order             DIET - DYS 1 Room service appropriate? Yes; Fluid consistency: Honey Thick  Diet effective now                   EDUCATION NEEDS:   No education needs have been identified at this time  Skin:  Skin Assessment: Reviewed RN Assessment  Last BM:  10/2 -type 5  Height:   Ht Readings from Last 1 Encounters:  12/21/21 5\' 2"  (1.575 m)    Weight:   Wt Readings from Last 1 Encounters:  12/21/21 47.7 kg    BMI:  Body mass index is 19.23 kg/m.  Estimated Nutritional Needs:   Kcal:  1450-1650  Protein:  70-80g  Fluid:  1.6L/day  Clayton Bibles, MS, RD, LDN Inpatient Clinical Dietitian Contact information available via Amion

## 2021-12-23 NOTE — Progress Notes (Signed)
PROGRESS NOTE    Anne Miller  [id number] DOB: 1918-12-04 DOA: 12/21/2021 PCP: Pcp, No    Brief Narrative:   Anne Miller is a 88 y.o. female with past medical history significant for dementia, nonverbal at baseline, essential hypertension, HLD, CAD, diet-controlled DM2, GERD, oropharyngeal dysphagia who presented from [facility] to [facility] ED on 9/30 with altered mental status, decreased oral intake/adult failure to thrive.  Due to her nonverbal status, advanced Alzheimer's disease majority of HPI obtained from son, ED physician and notes in EMR.  Patient's son who sees his mother daily noted that she appears to be more confused than her typical baseline with decreased oral intake over the last 36 hours.  Also she is not as alert as she typically is and at baseline utilizes a wheelchair but is able to feed herself for the most part in the dining facility and this has been now an acute change over the last 3 days.  In the ED, temperature 97.7 F, HR 105, RR 21, BP 180/92, SPO2 96% on room air.  WBC 11.9, hemoglobin 12.7, platelets 166.  Sodium 138, potassium 3.7, chloride 103, CO2 28, BUN 14, creatinine 0.59.  Glucose 169.  AST 22, ALT 215, total bilirubin 1.0.  High sensitive troponin 65>68.  Lactic acid 1.6.  CK18.  TSH 1.085.  COVID-19 PCR negative.  Influenza A/B PCR negative.  Urinalysis with large leukocytes, positive nitrite, many bacteria, greater than 50 WBCs.  UDS negative.  Chest x-ray with no acute cardiopulmonary disease process.  Abdominal x-ray with nonobstructive gas pattern.  Blood cultures x2 and urine cultures obtained.  Patient was started on empiric antibiotics with ceftriaxone and IV fluids.  TRH consulted for further evaluation management of altered mental status likely secondary to UTI with associated adult failure to thrive.  Assessment & Plan:   Acute metabolic encephalopathy Patient presenting to ED with increased confusion/decreased alertness  compared to her typical baseline.  Etiology likely multifactorial in the setting of dehydration, UTI as well as complicated by her advanced age and chronic comorbidities. --Continue IV fluid hydration, antibiotics, supportive care  E. coli septicemia, POA E. coli urinary tract infection, POA Urinalysis with large leukocytes, positive nitrite, many bacteria and greater than 50 WBCs.  Previous history of Klebsiella pneumonia and Pseudomonas. --WBC 11.9>7.2 --Blood cultures x2: 1/4 + GNR. BCID + Ecoli, pending further identification and susceptibilities --Urine culture: > 100K Ecoli; pending susceptibilities --Ceftriaxone 2 g IV every 24 hours  Oropharyngeal dysphagia Son reports increased issues with dysphagia over the last 1 year.  Has been on soft diet with nectar thickened liquids during this timeframe.  No formal speech therapy evaluation with instrumentation.  Seen by speech therapy, underwent modified barium swallow on 12/23/2021 --SLP following, appreciate assistance --Dysphagia 1 (Puree) solids;Honey thick liquids --Liquid Administration via: Cup;Spoon;No straw --Medication Administration: Crushed with puree --Supervision: Full supervision/cueing for compensatory strategies;Staff to assist with self feeding;Patient able to self feed --Compensations: Slow rate;Small sips/bites;Minimize environmental distractions --Postural Changes: Seated upright at 90 degrees;Remain semi-upright after after feeds/meals (Comment) (20-30 minutes) --Oral care BID;Oral care before and after PO --Aspiration precautions  Hypertensive urgency BP 180/92 on admission.  Home medications include amlodipine 2.5 mg p.o. daily. --Resume home amlodipine --Hydralazine 10mg  IV q6h PRN SBP >165 or DBP >110  Elevated troponin High sensitive troponin elevated at 65 followed by 68, flat.  Etiology likely secondary to type II demand ischemia in the setting of hypertensive urgency versus urinary tract infection as above.  EKG with normal sinus rhythm, no concerning T wave inversions, no ST segment elevations/depressions.  TTE with LVEF 50-55%, LV low normal function, LV demonstrates regional wall motion normalities, grade 1 diastolic dysfunction, LA moderately dilated, moderate MR, moderate to severe AS.   Hypokalemia: Resolved Potassium 4.1 this morning --Repeat electrolytes in a.m.  Type 2 diabetes mellitus Hemoglobin A1c, 5.0; well controlled.  Diet controlled at baseline. --SSI for coverage --CBGs qAC/HS  GERD: Continue PPI  Weakness/deconditioning/debility: Patient currently a resident of [facility].  Has been wheelchair-bound apparently over the last 1 year per son's report.  But usually is able to eat on her own for the most part in the dining facility.  DNR. --PT/OT following --Plan to return to living facility when medically stable, TOC consulted  Advanced Alzheimer's dementia --Delirium precautions --Get up during the day --Encourage a familiar face to remain present throughout the day --Keep blinds open and lights on during daylight hours --Minimize the use of opioids/benzodiazepines  Skin tear, left pretibial area, POA Seen by wound care RN with recommendations. nonadherent (Xeroform) gauze topped with dry gauze, an ABD for cushioning and secured with a few turns of Kerlix roll gauze/paper tape changed daily. This is to be performed following a NS cleanse and gentle pat dry. A sacral silicone foam bordered dressing is to be placed for PI prevention and the patient turned and repositioned to minimize time in the supine position. Her bilateral heels are to be floated.  Adult failure to thrive Patient's son reports 20 pound weight loss over the last year since moving to a soft diet with nectar thickened liquids.  Etiology complicated likely by her advanced age and comorbidities as well as her oral pharyngeal dysphagia. --Nutrition consult for evaluation and supplementation  recommendations    DVT prophylaxis: SCDs Start: 12/21/21 2054    Code Status: DNR Family Communication: Updated son present at bedside  Disposition Plan:  Level of care: Med-Surg Status is: Observation The patient remains OBS appropriate and will d/c before 2 midnights.    Consultants:  None  Procedures:  TTE  Antimicrobials:  Ceftriaxone 9/30 - 9/30, 10/2>> Cefepime 10/1 - 10/1   Subjective: Patient seen examined bedside, resting comfortably.  Alert but pleasantly confused.  Seen by PT this morning.  No family present at bedside this morning.  Discussed with speech therapy this morning following MBS earlier in the day.  Unable to obtain any ROS from patient due to her underlying dementia and she is fairly nonverbal at baseline per son's report.  No acute concerns overnight per nursing staff.  Objective: Vitals:   12/21/21 2212 12/22/21 0424 12/22/21 1220 12/22/21 1941  BP:  128/60 (!) 159/85 (!) 154/66  Pulse:  67 90 80  Resp:  20 18 16   Temp:  98.1 F (36.7 C) 98.5 F (36.9 C) (!) 97.5 F (36.4 C)  TempSrc:  Oral Oral Oral  SpO2:  98% 95% 96%  Weight: 47.7 kg     Height: 5\' 2"  (1.575 m)       Intake/Output Summary (Last 24 hours) at 12/23/2021 1234 Last data filed at 12/23/2021 0800 Gross per 24 hour  Intake 1313.33 ml  Output 675 ml  Net 638.33 ml   Filed Weights   12/21/21 2049 12/21/21 2212  Weight: 48.1 kg 47.7 kg    Examination:  Physical Exam: GEN: NAD, alert, pleasantly confused HEENT: NCAT, PERRL, EOMI, sclera clear, dry mucous membranes PULM: CTAB w/o wheezes/crackles, normal respiratory effort, on room air  CV: RRR w/o M/G/R GI: abd soft, NTND, NABS, no R/G/M MSK: no peripheral edema, Integumentary: dry/intact, no rashes or wounds    Data Reviewed: I have personally reviewed following labs and imaging studies  CBC: Recent Labs  Lab 12/21/21 1504 12/22/21 0828 12/23/21 0438  WBC 11.9* 7.4 7.2  NEUTROABS 10.6*  --   --   HGB 12.7  11.3* 11.6*  HCT 38.8 35.4* 36.2  MCV 94.6 96.5 95.8  PLT 166 145* 0000000   Basic Metabolic Panel: Recent Labs  Lab 12/21/21 1504 12/21/21 2117 12/22/21 0828 12/23/21 0438  NA 138  --  137 136  K 3.7  --  3.4* 4.1  CL 103  --  106 106  CO2 28  --  27 25  GLUCOSE 169*  --  109* 100*  BUN 14  --  12 12  CREATININE 0.59  --  0.52 0.57  CALCIUM 8.9  --  8.1* 8.3*  MG  --  2.0 1.8 1.9  PHOS  --  2.6 2.6 2.3*   GFR: Estimated Creatinine Clearance: 28.2 mL/min (by C-G formula based on SCr of 0.57 mg/dL). Liver Function Tests: Recent Labs  Lab 12/21/21 1504 12/22/21 0828  AST 22 16  ALT 15 13  ALKPHOS 88 72  BILITOT 1.0 0.9  PROT 7.3 6.1*  ALBUMIN 3.3* 2.7*   No results for input(s): "LIPASE", "AMYLASE" in the last 168 hours. No results for input(s): "AMMONIA" in the last 168 hours. Coagulation Profile: Recent Labs  Lab 12/21/21 1504  INR 1.1   Cardiac Enzymes: Recent Labs  Lab 12/21/21 2117  CKTOTAL 18*   BNP (last 3 results) No results for input(s): "PROBNP" in the last 8760 hours. HbA1C: Recent Labs    12/21/21 2117  HGBA1C 5.0   CBG: Recent Labs  Lab 12/22/21 2005 12/23/21 0012 12/23/21 0409 12/23/21 0756 12/23/21 1233  GLUCAP 130* 99 90 96 99   Lipid Profile: No results for input(s): "CHOL", "HDL", "LDLCALC", "TRIG", "CHOLHDL", "LDLDIRECT" in the last 72 hours. Thyroid Function Tests: Recent Labs    12/21/21 2117  TSH 1.085   Anemia Panel: No results for input(s): "VITAMINB12", "FOLATE", "FERRITIN", "TIBC", "IRON", "RETICCTPCT" in the last 72 hours. Sepsis Labs: Recent Labs  Lab 12/21/21 1504 12/21/21 1711 12/21/21 2117  PROCALCITON  --   --  <0.10  LATICACIDVEN 1.3 1.6  --     Recent Results (from the past 240 hour(s))  Culture, blood (routine x 2)     Status: Abnormal (Preliminary result)   Collection Time: 12/21/21  3:04 PM   Specimen: BLOOD  Result Value Ref Range Status   Specimen Description   Final    BLOOD Performed at  [facility], [place] [address], [place], [place] [address]    Special Requests   Final    NONE Performed at [facility], [place] [address] [place], [place] [address]    Culture  Setup Time   Final    GRAM NEGATIVE RODS AEROBIC BOTTLE ONLY Organism ID to follow CRITICAL RESULT CALLED TO, READ BACK BY AND VERIFIED WITH: EMaricela Bo PHARMD, AT 1822 12/22/21 D. VANHOOK    Culture (A)  Final    ESCHERICHIA COLI SUSCEPTIBILITIES TO FOLLOW Performed at [facility], [place] [address] [place], [place] [address]    Report Status PENDING  Incomplete  Blood Culture ID Panel (Reflexed)     Status: Abnormal   Collection Time: 12/21/21  3:04 PM  Result Value  Ref Range Status   Enterococcus faecalis NOT DETECTED NOT DETECTED Final   Enterococcus Faecium NOT DETECTED NOT DETECTED Final   Listeria monocytogenes NOT DETECTED NOT DETECTED Final   Staphylococcus species NOT DETECTED NOT DETECTED Final   Staphylococcus aureus (BCID) NOT DETECTED NOT DETECTED Final   Staphylococcus epidermidis NOT DETECTED NOT DETECTED Final   Staphylococcus lugdunensis NOT DETECTED NOT DETECTED Final   Streptococcus species NOT DETECTED NOT DETECTED Final   Streptococcus agalactiae NOT DETECTED NOT DETECTED Final   Streptococcus pneumoniae NOT DETECTED NOT DETECTED Final   Streptococcus pyogenes NOT DETECTED NOT DETECTED Final   A.calcoaceticus-baumannii NOT DETECTED NOT DETECTED Final   Bacteroides fragilis NOT DETECTED NOT DETECTED Final   Enterobacterales DETECTED (A) NOT DETECTED Final    Comment: Enterobacterales represent a large order of gram negative bacteria, not a single organism. CRITICAL RESULT CALLED TO, READ BACK BY AND VERIFIED WITH: EMaricela Bo PHARMD, AT 1822 12/22/21 D. VANHOOK    Enterobacter cloacae complex NOT DETECTED NOT DETECTED Final   Escherichia coli DETECTED (A) NOT DETECTED Final    Comment: CRITICAL RESULT CALLED TO, READ BACK BY AND VERIFIED  WITH: EMaricela Bo PHARMD, AT 1822 12/22/21 D. VANHOOK    Klebsiella aerogenes NOT DETECTED NOT DETECTED Final   Klebsiella oxytoca NOT DETECTED NOT DETECTED Final   Klebsiella pneumoniae NOT DETECTED NOT DETECTED Final   Proteus species NOT DETECTED NOT DETECTED Final   Salmonella species NOT DETECTED NOT DETECTED Final   Serratia marcescens NOT DETECTED NOT DETECTED Final   Haemophilus influenzae NOT DETECTED NOT DETECTED Final   Neisseria meningitidis NOT DETECTED NOT DETECTED Final   Pseudomonas aeruginosa NOT DETECTED NOT DETECTED Final   Stenotrophomonas maltophilia NOT DETECTED NOT DETECTED Final   Candida albicans NOT DETECTED NOT DETECTED Final   Candida auris NOT DETECTED NOT DETECTED Final   Candida glabrata NOT DETECTED NOT DETECTED Final   Candida krusei NOT DETECTED NOT DETECTED Final   Candida parapsilosis NOT DETECTED NOT DETECTED Final   Candida tropicalis NOT DETECTED NOT DETECTED Final   Cryptococcus neoformans/gattii NOT DETECTED NOT DETECTED Final   CTX-M ESBL NOT DETECTED NOT DETECTED Final   Carbapenem resistance IMP NOT DETECTED NOT DETECTED Final   Carbapenem resistance KPC NOT DETECTED NOT DETECTED Final   Carbapenem resistance NDM NOT DETECTED NOT DETECTED Final   Carbapenem resist OXA 48 LIKE NOT DETECTED NOT DETECTED Final   Carbapenem resistance VIM NOT DETECTED NOT DETECTED Final    Comment: Performed at [facility], [address], [place], [place] [address]  Culture, blood (routine x 2)     Status: None (Preliminary result)   Collection Time: 12/21/21  3:10 PM   Specimen: BLOOD  Result Value Ref Range Status   Specimen Description   Final    BLOOD RIGHT ANTECUBITAL Performed at [place] [address] [place], [place] [address]    Special Requests   Final    BOTTLES DRAWN AEROBIC AND ANAEROBIC Blood Culture results may not be optimal due to an excessive volume of blood received in culture bottles Performed at [place] [address] [place], [place] [address]    Culture   Final    NO GROWTH 2 DAYS Performed at [place] [address], [place],  [address]    Report Status PENDING  Incomplete  Resp Panel by RT-PCR (Flu A&B, Covid) Anterior Nasal Swab     Status: None   Collection Time:  12/21/21  4:00 PM   Specimen: Anterior Nasal Swab  Result Value Ref Range Status   SARS Coronavirus 2 by RT PCR NEGATIVE NEGATIVE Final    Comment: (NOTE) SARS-CoV-2 target nucleic acids are NOT DETECTED.  The SARS-CoV-2 RNA is generally detectable in upper respiratory specimens during the acute phase of infection. The lowest concentration of SARS-CoV-2 viral copies this assay can detect is 138 copies/mL. A negative result does not preclude SARS-Cov-2 infection and should not be used as the sole basis for treatment or other patient management decisions. A negative result may occur with  improper specimen collection/handling, submission of specimen other than nasopharyngeal swab, presence of viral mutation(s) within the areas targeted by this assay, and inadequate number of viral copies(<138 copies/mL). A negative result must be combined with clinical observations, patient history, and epidemiological information. The expected result is Negative.  Fact Sheet for Patients:  EntrepreneurPulse.com.au  Fact Sheet for Healthcare Providers:  IncredibleEmployment.be  This test is no t yet approved or cleared by the [place] FDA and  has been authorized for detection and/or diagnosis of SARS-CoV-2 by FDA under an Emergency Use Authorization (EUA). This EUA will remain  in effect (meaning this test can be used) for the duration of the COVID-19 declaration under Section 564(b)(1) of the Act, 21 U.S.C.section 360bbb-3(b)(1), unless the authorization is terminated  or revoked sooner.       Influenza A by PCR NEGATIVE NEGATIVE Final    Influenza B by PCR NEGATIVE NEGATIVE Final    Comment: (NOTE) The Xpert Xpress SARS-CoV-2/FLU/RSV plus assay is intended as an aid in the diagnosis of influenza from Nasopharyngeal swab specimens and should not be used as a sole basis for treatment. Nasal washings and aspirates are unacceptable for Xpert Xpress SARS-CoV-2/FLU/RSV testing.  Fact Sheet for Patients: EntrepreneurPulse.com.au  Fact Sheet for Healthcare Providers: IncredibleEmployment.be  This test is not yet approved or cleared by the [place] FDA and has been authorized for detection and/or diagnosis of SARS-CoV-2 by FDA under an Emergency Use Authorization (EUA). This EUA will remain in effect (meaning this test can be used) for the duration of the COVID-19 declaration under Section 564(b)(1) of the Act, 21 U.S.C. section 360bbb-3(b)(1), unless the authorization is terminated or revoked.  Performed at [facility], [place] [address] [place], [place] [address]   Urine Culture     Status: Abnormal (Preliminary result)   Collection Time: 12/21/21  7:19 PM   Specimen: Urine, Clean Catch  Result Value Ref Range Status   Specimen Description   Final    URINE, CLEAN CATCH Performed at [facility], [place] [address], [place], [place] [address]    Special Requests   Final    NONE Performed at [facility], [place] [address], [place], [place] [address]    Culture >=100,000 COLONIES/mL ESCHERICHIA COLI (A)  Final   Report Status PENDING  Incomplete         Radiology Studies: DG Swallowing Func-Speech Pathology  Result Date: 12/23/2021 Table formatting from the original result was not included. Objective Swallowing Evaluation: Type of Study: MBS-Modified Barium Swallow Study  Patient Details Name: Aaliya Schenone MRN: [id number] Date of Birth: 19-Feb-1919 Today's Date: 12/23/2021 Time: SLP Start Time (ACUTE ONLY): 0830 -SLP Stop  Time (ACUTE ONLY): 0845 SLP Time Calculation (min) (ACUTE ONLY): 15 min Past Medical History: Past Medical History: Diagnosis Date  Coronary artery disease   Dementia ([place])   Diabetes mellitus without complication ([place])  GERD (gastroesophageal reflux disease)   Hearing loss   Hyperlipidemia   Hypertension  Past Surgical History: No past surgical history on file. HPI: Pt is a 88 y.o. female who presented with change in mental status. CXR on admission negative and pt dx with UTI. PMH: dementia hypertension, GERD, HLD, CAD, type 2 diabetes. BSE 12/11/17: functional oropharyngeal swallow; rx regular/thin.  Subjective: pleasant, cooperative, largely non-verbal  Recommendations for follow up therapy are one component of a multi-disciplinary discharge planning process, led by the attending physician.  Recommendations may be updated based on patient status, additional functional criteria and insurance authorization. Assessment / Plan / Recommendation   12/23/2021  10:15 AM Clinical Impressions Clinical Impression Patient presents with a mild oral and a moderate pharyngeal phase dysphagia. Bolus consistencies tested during today's MBS were: puree/pudding thick, honey thick, nectar thick, thin.  During oral phase, she exhibited delayed anterior to posterior transit of liquids and solids, leading to premature spillage of puree solids and honey thick liquids into laryngeal vestibule. No significant delays in swallow initiation observed with any of the tested bolus consistencies.With thin liquids via spoon sip she exhibited penetration during the swallow which did not clear laryngeal vestibule and did lead to eventual aspiration. With thin liquids via cup sips, she exhibited consistent aspiration during the swallow as well as aspiration after the swallow from residuals still in laryngeal vestibule. With nectar thick liquids via spoon sip she exhibited trace penetration which did not clear and with cup sips of nectar thick  liquids, she exhibited penetration during the swallow which led to aspiration of penetrates after the swallow. With honey thick liquids via spoon sip, no penetration or aspiration occured and no pharyngeal residuals. With small controlled cup sips, patient did have one incident of trace penetration which did not immediately clear, but eventually cleared laryngeal vestibule without any observed aspiration.Trace residuals observed in vallecular sinus, pyriform sinus s/p initial swallow of thin liquids but with honey thick, nectar thick and puree consistencies, no signficant amount of pharyngeal residuals observed. SLP observed patient to have instance of throat clearing and audible secretions when flurou was turned off but when turned back on, no observed barium contrast in pharyngeal or laryngeal areas. Suspect she patient was having penetration, possibly aspiration of her secretions. Patient is unable to follow any directions so no swallow strategies attempted. SLP is recommending downgrade diet to Dys 1(puree) solids, honey thick liquids due to her mod-severe risk of aspiration. Depending on [place], may consider water protocol in between meals with thin liquid (only plain water) given by spoon or measured cup sip ([place] 5cc). SLP will follow for toleration, education with family and assistance in determining LRD.     12/23/2021  10:13 AM Treatment Recommendations Treatment Recommendations Therapy as outlined in treatment plan below     12/23/2021  10:15 AM Prognosis Prognosis for Safe Diet Advancement Fair Barriers to Reach Goals Cognitive deficits;Time post onset;Severity of deficits   12/23/2021  10:13 AM Diet Recommendations SLP Diet Recommendations Dysphagia 1 (Puree) solids;Honey thick liquids Liquid Administration via Cup;Spoon;No straw Medication Administration Crushed with puree Compensations Slow rate;Small sips/bites;Minimize environmental distractions Postural Changes Seated upright at 90 degrees;Remain  semi-upright after after feeds/meals (Comment)     12/23/2021  10:13 AM Other Recommendations Oral Care Recommendations Oral care BID;Oral care before and after PO;Staff/trained caregiver to provide oral care Other Recommendations Order thickener from pharmacy Follow Up Recommendations Skilled nursing-short term rehab (<3 hours/day) Assistance recommended at discharge Frequent or constant Supervision/Assistance Functional Status  Assessment Patient has had a recent decline in their functional status and demonstrates the ability to make significant improvements in function in a reasonable and predictable amount of time.   12/23/2021  10:13 AM Frequency and Duration  Speech Therapy Frequency (ACUTE ONLY) min 2x/week Treatment Duration 2 weeks     12/23/2021  10:02 AM Oral Phase Oral Phase Impaired Oral - Honey Teaspoon Reduced posterior propulsion;Premature spillage;Weak lingual manipulation Oral - Honey Cup Weak lingual manipulation;Premature spillage;Reduced posterior propulsion Oral - Nectar Teaspoon Weak lingual manipulation;Reduced posterior propulsion Oral - Nectar Cup Weak lingual manipulation;Reduced posterior propulsion Oral - Thin Teaspoon Weak lingual manipulation;Reduced posterior propulsion Oral - Thin Cup Weak lingual manipulation;Reduced posterior propulsion Oral - Puree Weak lingual manipulation;Reduced posterior propulsion;Premature spillage    12/23/2021  10:04 AM Pharyngeal Phase Pharyngeal Phase Impaired Pharyngeal Material does not enter airway Pharyngeal- Honey Cup Reduced airway/laryngeal closure;Penetration/Aspiration during swallow Pharyngeal Material enters airway, remains ABOVE vocal cords and not ejected out Pharyngeal- Nectar Teaspoon Penetration/Aspiration during swallow;Reduced airway/laryngeal closure Pharyngeal Material enters airway, remains ABOVE vocal cords and not ejected out Pharyngeal- Nectar Cup Reduced airway/laryngeal closure;Penetration/Aspiration during  swallow;Penetration/Apiration after swallow Pharyngeal Material enters airway, remains ABOVE vocal cords and not ejected out;Material enters airway, CONTACTS cords and not ejected out;Material enters airway, passes BELOW cords without attempt by patient to eject out (silent aspiration) Pharyngeal- Thin Teaspoon Reduced airway/laryngeal closure;Penetration/Apiration after swallow;Penetration/Aspiration during swallow;Pharyngeal residue - valleculae;Pharyngeal residue - pyriform Pharyngeal Material enters airway, passes BELOW cords without attempt by patient to eject out (silent aspiration);Material enters airway, remains ABOVE vocal cords and not ejected out Pharyngeal- Thin Cup Penetration/Aspiration during swallow;Penetration/Apiration after swallow;Reduced airway/laryngeal closure;Pharyngeal residue - valleculae;Pharyngeal residue - pyriform;Pharyngeal residue - posterior pharnyx Pharyngeal Material enters airway, remains ABOVE vocal cords and not ejected out;Material enters airway, passes BELOW cords without attempt by patient to eject out (silent aspiration);Material enters airway, CONTACTS cords and not ejected out Pharyngeal- Puree [facility]    12/23/2021  10:12 AM Cervical Esophageal Phase  Cervical Esophageal Phase Impaired Cervical Esophageal Comment presence of cervical osteophytes (no radiologist present to confirm) 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 Exam Location:  Inpatient Procedure: 2D Echo, Cardiac Doppler and Color Doppler  Indications:    Elevated troponin  History:        Patient has prior history of Echocardiogram examinations, most                 recent 12/11/2017. CAD; Risk Factors:Hypertension, Diabetes and  Dyslipidemia.  Sonographer:    Jefferey Pica Referring Phys: [place]  1. Left ventricular ejection fraction, by estimation, is 50 to 55%. The left ventricle has low normal function. The left ventricle demonstrates regional wall motion abnormalities (see scoring diagram/findings for description). Left ventricular diastolic  parameters are consistent with Grade I diastolic dysfunction (impaired relaxation).  2. Right ventricular systolic function is normal. The right ventricular size is normal. There is moderately elevated pulmonary artery systolic pressure. The estimated right ventricular systolic pressure is 03.4 mmHg.  3. Left atrial size was mild to moderately dilated.  4. The mitral valve is abnormal. Mild to moderate mitral valve regurgitation.  5. The aortic valve is tricuspid. There is moderate to severe calcification of the aortic valve. Aortic valve regurgitation is trivial. Moderate to severe aortic valve stenosis, upper end of scale. Aortic valve mean gradient measures 31.0 mmHg. Aortic valve Vmax measures 3.45 m/s. Dimentionless index 0.25.  6. The inferior vena cava is normal in size with <50% respiratory variability, suggesting right atrial pressure of 8 mmHg. Comparison(s): Prior images unable to be directly viewed, comparison made by report only. FINDINGS  Left Ventricle: Left ventricular ejection fraction, by estimation, is 50 to 55%. The left ventricle has low normal function. The left ventricle demonstrates regional wall motion abnormalities. The left ventricular internal cavity size was normal in size. There is borderline left ventricular hypertrophy. Left ventricular diastolic parameters are consistent with Grade I diastolic dysfunction (impaired relaxation).   LV Wall Scoring: The mid and distal anterior septum, apical anterior segment, apical inferior segment, and apex are hypokinetic. The anterior wall, entire lateral wall, inferior wall, basal anteroseptal segment, mid inferoseptal segment, and basal inferoseptal segment are normal. Right Ventricle: The right ventricular size is normal. No increase in right ventricular wall thickness. Right ventricular systolic function is normal. There is moderately elevated pulmonary artery systolic pressure. The tricuspid regurgitant velocity is 3.57 m/s, and with an assumed right atrial pressure of 8 mmHg, the estimated right ventricular systolic pressure is 74.2 mmHg. Left Atrium: Left atrial size was mild to moderately dilated. Right Atrium: Right atrial size was normal in size. Pericardium: There is no evidence of pericardial effusion. Mitral Valve: The mitral valve is abnormal. There is mild calcification of the mitral valve leaflet(s). Mild mitral annular calcification. Mild to moderate mitral valve regurgitation. Tricuspid Valve: The tricuspid valve is grossly normal. Tricuspid valve regurgitation is mild. Aortic Valve: The aortic valve is tricuspid. There is moderate calcification of the aortic valve. Aortic valve regurgitation is trivial. Moderate to severe aortic stenosis is present. Aortic valve mean gradient measures 31.0 mmHg. Aortic valve peak gradient measures 47.5 mmHg. Aortic valve area, by VTI measures 0.71 cm. Pulmonic Valve: The pulmonic valve was not well visualized. Pulmonic valve regurgitation is trivial. Aorta: The aortic root is normal in size and structure. Venous: The inferior vena cava is normal in size with less than 50% respiratory variability, suggesting right atrial pressure of 8 mmHg. IAS/Shunts: No atrial level shunt detected by color flow Doppler.  LEFT VENTRICLE PLAX 2D LVIDd:         4.30 cm LVIDs:         3.00 cm LV PW:         1.00 cm LV IVS:        1.00 cm LVOT diam:     1.90 cm LV SV:          50 LV SV Index:   35 LVOT Area:  2.84 cm  LV Volumes (MOD) LV vol d, MOD A4C: 103.0 ml LV vol s, MOD A4C: 53.4 ml LV SV MOD A4C:     103.0 ml RIGHT VENTRICLE          IVC RV Basal diam:  2.70 cm  IVC diam: 2.00 cm TAPSE (M-mode): 2.5 cm LEFT ATRIUM             Index        RIGHT ATRIUM           Index LA diam:        3.30 cm 2.27 cm/m   RA Area:     16.00 cm LA Vol (A2C):   69.6 ml 47.85 ml/m  RA Volume:   39.00 ml  26.81 ml/m LA Vol (A4C):   59.0 ml 40.56 ml/m LA Biplane Vol: 66.1 ml 45.44 ml/m  AORTIC VALVE AV Area (Vmax):    0.83 cm AV Area (Vmean):   0.71 cm AV Area (VTI):     0.71 cm AV Vmax:           344.67 cm/s AV Vmean:          239.667 cm/s AV VTI:            0.715 m AV Peak Grad:      47.5 mmHg AV Mean Grad:      31.0 mmHg LVOT Vmax:         101.00 cm/s LVOT Vmean:        60.200 cm/s LVOT VTI:          0.178 m LVOT/AV VTI ratio: 0.25  AORTA Ao Root diam: 3.20 cm Ao Asc diam:  3.10 cm TRICUSPID VALVE TR Peak grad:   51.0 mmHg TR Vmax:        357.00 cm/s  SHUNTS Systemic VTI:  0.18 m Systemic Diam: 1.90 cm Rozann Lesches MD Electronically signed by Rozann Lesches MD Signature Date/Time: 12/22/2021/11:56:11 AM    Final    DG Abd 1 View  Result Date: 12/21/2021 CLINICAL DATA:  Distension possible sepsis EXAM: ABDOMEN - 1 VIEW COMPARISON:  11/27/2017 FINDINGS: Overall decreased bowel gas with some scattered colon gas in the right lower quadrant. No radiopaque calculi. Hardware in the left hip. IMPRESSION: Overall nonobstructed gas pattern. Electronically Signed   By: Donavan Foil M.D.   On: 12/21/2021 19:18   DG Chest Port 1 View  Result Date: 12/21/2021 CLINICAL DATA:  Possible sepsis. EXAM: PORTABLE CHEST 1 VIEW COMPARISON:  07/13/2021. FINDINGS: Cardiac silhouette is mildly enlarged. No mediastinal or hilar masses. Lungs are clear.  No convincing pleural effusion.  No pneumothorax. Skeletal structures are demineralized, grossly intact. IMPRESSION: No acute cardiopulmonary disease.  Electronically Signed   By: Lajean Manes M.D.   On: 12/21/2021 16:08        Scheduled Meds:  amLODipine  2.5 mg Oral Q breakfast   insulin aspart  0-9 Units Subcutaneous Q4H   pantoprazole  40 mg Oral Daily   Continuous Infusions:  0.9 % NaCl with KCl 20 mEq / L 50 mL/hr at 12/23/21 1144   cefTRIAXone (ROCEPHIN)  IV       LOS: 1 day    Time spent: 46 minutes spent on chart review, discussion with nursing staff, consultants, updating family and interview/physical exam; more than 50% of that time was spent in counseling and/or coordination of care.    Jozalynn Noyce J [place] DO [facility] Available via Epic secure chat 7am-7pm After these hours,  please refer to coverage provider listed on [email or web address] 12/23/2021, 12:34 PM

## 2021-12-23 NOTE — Evaluation (Signed)
Occupational Therapy Evaluation and Discharge Patient Details Name: Anne Miller MRN: [id number] DOB: 1918/11/07 Today's Date: 12/23/2021   History of Present Illness [name] is a 88 y.o. female who presented with change in mental status. CXR on admission negative and pt dx with UTI. PMH: dementia hypertension, GERD, HLD, CAD, type 2 diabetes. BSE 12/11/17: functional oropharyngeal swallow; rx regular/thin, advanced Alzheimer's disease.   Clinical Impression   This 88 yo sweet female admitted with above from a SNF presents to acute OT at what appears close to if not at her baseline. No skilled OT needs identified, we will sign off.      Recommendations for follow up therapy are one component of a multi-disciplinary discharge planning process, led by the attending physician.  Recommendations may be updated based on patient status, additional functional criteria and insurance authorization.   Follow Up Recommendations  No OT follow up    Assistance Recommended at Discharge Frequent or constant Supervision/Assistance  Patient can return home with the following Two people to help with walking and/or transfers;A lot of help with bathing/dressing/bathroom;Assistance with feeding;Assistance with cooking/housework;Help with stairs or ramp for entrance;Assist for transportation;Direct supervision/assist for financial management;Direct supervision/assist for medications management    Functional Status Assessment  Patient has had a recent decline in their functional status and demonstrates the ability to make significant improvements in function in a reasonable and predictable amount of time. (but does not have any skilled OT needs)  Equipment Recommendations  None recommended by OT       Precautions / Restrictions Precautions Precautions: Fall Restrictions Weight Bearing Restrictions: No      Mobility Bed Mobility Overal bed mobility: Needs Assistance Bed Mobility: Supine to Sit      Supine to sit: Total assist, +2 for physical assistance, HOB elevated          Transfers Overall transfer level: Needs assistance Equipment used: 2 person hand held assist Transfers: Sit to/from Stand, Bed to chair/wheelchair/BSC Sit to Stand: +2 safety/equipment, Total assist, +2 physical assistance, Max assist Stand pivot transfers: Max assist, +2 physical assistance, +2 safety/equipment, Total assist         General transfer comment: Max/Total +2 for stand from EOB with pivot to recliner. use of bed pad to lift/initiate rise at hips and PT/OT blocking knees and guiding feet/hips to turn to recliner.      Balance Overall balance assessment: Needs assistance Sitting-balance support: Feet supported, Bilateral upper extremity supported Sitting balance-Leahy Scale: Poor Sitting balance - Comments: pt required assist to prevent posterior and Lt lean constantly.   Standing balance support: Bilateral upper extremity supported Standing balance-Leahy Scale: Zero Standing balance comment: Max/Total +2                           ADL either performed or assessed with clinical judgement   ADL Overall ADL's : Needs assistance/impaired                                       General ADL Comments: total A for all, except can feed self some                  Pertinent Vitals/Pain Pain Assessment Pain Assessment: PAINAD Breathing: normal Negative Vocalization: none Facial Expression: sad, frightened, frown (once with getting front peri area cleaned up) Body Language: relaxed Consolability: no need to console PAINAD  Score: 1 Pain Location: front peri area with getting cleaned up Pain Intervention(s): Repositioned     Hand Dominance  (unknown)   Extremity/Trunk Assessment Upper Extremity Assessment Upper Extremity Assessment: Generalized weakness   Lower Extremity Assessment Lower Extremity Assessment: Generalized weakness   Cervical / Trunk  Assessment Cervical / Trunk Assessment: Kyphotic   Communication Communication Communication: HOH (hearing aid right ear, does read (delayed))   Cognition Arousal/Alertness: Awake/alert Behavior During Therapy:  (smiles when spoken too) Overall Cognitive Status: History of cognitive impairments - at baseline                                 General Comments: She will spontaneously read things she sees and can answer questions with delay (75% of time)                Home Living Family/patient expects to be discharged to:: Skilled nursing facility                                 Additional Comments: Advanced Alzheimers--total care for all basic ADLs and mobility      Prior Functioning/Environment Prior Level of Function : Needs assist  Cognitive Assist : Mobility (cognitive);ADLs (cognitive) Mobility (Cognitive): Step by step cues ADLs (Cognitive): Step by step cues Physical Assist : Mobility (physical);ADLs (physical) Mobility (physical): Bed mobility;Transfers ADLs (physical): Feeding;Bathing;Grooming;Dressing;Toileting Mobility Comments: total A +2 ADLs Comments: total A at bed level        OT Problem List: Decreased strength;Decreased range of motion;Impaired balance (sitting and/or standing);Pain;Decreased cognition (baseline)         OT Goals(Current goals can be found in the care plan section) Acute Rehab OT Goals Patient Stated Goal: unable OT Goal Formulation: Patient unable to participate in goal setting      Co-evaluation PT/OT/SLP Co-Evaluation/Treatment: Yes Reason for Co-Treatment: For patient/therapist safety;To address functional/ADL transfers;Necessary to address cognition/behavior during functional activity PT goals addressed during session: Mobility/safety with mobility;Balance OT goals addressed during session: ADL's and self-care;Strengthening/ROM      AM-PAC OT "6 Clicks" Daily Activity     Outcome Measure Help  from another person eating meals?: A Lot Help from another person taking care of personal grooming?: Total Help from another person toileting, which includes using toliet, bedpan, or urinal?: Total Help from another person bathing (including washing, rinsing, drying)?: Total Help from another person to put on and taking off regular upper body clothing?: Total Help from another person to put on and taking off regular lower body clothing?: Total 6 Click Score: 7   End of Session Nurse Communication: Mobility status;Need for lift equipment (lift pad left under her)  Activity Tolerance: Patient tolerated treatment well Patient left: in chair;with call bell/phone within reach;with chair alarm set  OT Visit Diagnosis: Other abnormalities of gait and mobility (R26.89);Muscle weakness (generalized) (M62.81);Other symptoms and signs involving cognitive function                Time: 1022-1056 OT Time Calculation (min): 34 min Charges:  OT General Charges $OT Visit: 1 Visit OT Evaluation $OT Eval Moderate Complexity: 1 Mod  Ignacia [place], OTR/L Acute [employer] [facility] [phone] Office [phone]    Evette Georges 12/23/2021, 1:07 PM

## 2021-12-24 DIAGNOSIS — E43 Unspecified severe protein-calorie malnutrition: Secondary | ICD-10-CM | POA: Insufficient documentation

## 2021-12-24 DIAGNOSIS — A4150 Gram-negative sepsis, unspecified: Secondary | ICD-10-CM | POA: Diagnosis not present

## 2021-12-24 LAB — URINE CULTURE: Culture: >=100000 — AB

## 2021-12-24 LAB — GLUCOSE, CAPILLARY
Glucose-Capillary: 99 mg/dL (ref 70–99)
Glucose-Capillary: 96 mg/dL (ref 70–99)
Glucose-Capillary: 108 mg/dL — ABNORMAL HIGH (ref 70–99)
Glucose-Capillary: 134 mg/dL — ABNORMAL HIGH (ref 70–99)
Glucose-Capillary: 113 mg/dL — ABNORMAL HIGH (ref 70–99)
Glucose-Capillary: 126 mg/dL — ABNORMAL HIGH (ref 70–99)

## 2021-12-24 LAB — MAGNESIUM: Magnesium: 1.7 mg/dL (ref 1.7–2.4)

## 2021-12-24 LAB — BASIC METABOLIC PANEL WITH GFR
Anion gap: 6 (ref 5–15)
BUN: 12 mg/dL (ref 8–23)
CO2: 27 mmol/L (ref 22–32)
Calcium: 8.5 mg/dL — ABNORMAL LOW (ref 8.9–10.3)
Chloride: 104 mmol/L (ref 98–111)
Creatinine, Ser: 0.55 mg/dL (ref 0.44–1.00)
GFR, Estimated: >60 mL/min
Glucose, Bld: 105 mg/dL — ABNORMAL HIGH (ref 70–99)
Potassium: 4.1 mmol/L (ref 3.5–5.1)
Sodium: 137 mmol/L (ref 135–145)

## 2021-12-24 LAB — CBC
HCT: 35.7 % — ABNORMAL LOW (ref 36.0–46.0)
Hemoglobin: 11.6 g/dL — ABNORMAL LOW (ref 12.0–15.0)
MCH: 30.9 pg (ref 26.0–34.0)
MCHC: 32.5 g/dL (ref 30.0–36.0)
MCV: 94.9 fL (ref 80.0–100.0)
Platelets: 167 10*3/uL (ref 150–400)
RBC: 3.76 MIL/uL — ABNORMAL LOW (ref 3.87–5.11)
RDW: 13.2 % (ref 11.5–15.5)
WBC: 5.9 10*3/uL (ref 4.0–10.5)
nRBC: 0 % (ref 0.0–0.2)

## 2021-12-24 LAB — CULTURE, BLOOD (ROUTINE X 2)

## 2021-12-24 MED ORDER — PANTOPRAZOLE 2 MG/ML SUSPENSION
40.0000 mg | Freq: Every day | ORAL | Status: DC
  Administered 2021-12-24 – 2021-12-25 (×2): 40 mg via ORAL
  Filled 2021-12-24 (×2): qty 20

## 2021-12-24 MED ORDER — AMLODIPINE BESYLATE 5 MG PO TABS
5.0000 mg | ORAL_TABLET | Freq: Every day | ORAL | Status: DC
  Administered 2021-12-25: 5 mg via ORAL
  Filled 2021-12-24: qty 1

## 2021-12-24 MED ORDER — MAGNESIUM SULFATE 2 GM/50ML IV SOLN
2.0000 g | Freq: Once | INTRAVENOUS | Status: AC
  Administered 2021-12-24: 2 g via INTRAVENOUS
  Filled 2021-12-24: qty 50

## 2021-12-24 MED ORDER — INSULIN ASPART 100 UNIT/ML IJ SOLN
0.0000 [IU] | Freq: Three times a day (TID) | INTRAMUSCULAR | Status: DC
  Administered 2021-12-24: 1 [IU] via SUBCUTANEOUS

## 2021-12-24 MED ORDER — CEFAZOLIN SODIUM-DEXTROSE 2-4 GM/100ML-% IV SOLN
2.0000 g | Freq: Two times a day (BID) | INTRAVENOUS | Status: DC
  Administered 2021-12-24 (×2): 2 g via INTRAVENOUS
  Filled 2021-12-24 (×3): qty 100

## 2021-12-24 NOTE — Progress Notes (Addendum)
PROGRESS NOTE    Anne Miller  BPZ:[id number] DOB: 21-Nov-1918 DOA: 12/21/2021 PCP: Pcp, No    Brief Narrative:   Anne Miller is a 88 y.o. female with past medical history significant for dementia, nonverbal at baseline, essential hypertension, HLD, CAD, diet-controlled DM2, GERD, oropharyngeal dysphagia who presented from [facility] to [facility] ED on 9/30 with altered mental status, decreased oral intake/adult failure to thrive.  Due to her nonverbal status, advanced Alzheimer's disease majority of HPI obtained from son, ED physician and notes in EMR.  Patient's son who sees his mother daily noted that she appears to be more confused than her typical baseline with decreased oral intake over the last 36 hours.  Also she is not as alert as she typically is and at baseline utilizes a wheelchair but is able to feed herself for the most part in the dining facility and this has been now an acute change over the last 3 days.  In the ED, temperature 97.7 F, HR 105, RR 21, BP 180/92, SPO2 96% on room air.  WBC 11.9, hemoglobin 12.7, platelets 166.  Sodium 138, potassium 3.7, chloride 103, CO2 28, BUN 14, creatinine 0.59.  Glucose 169.  AST 22, ALT 215, total bilirubin 1.0.  High sensitive troponin 65>68.  Lactic acid 1.6.  CK18.  TSH 1.085.  COVID-19 PCR negative.  Influenza A/B PCR negative.  Urinalysis with large leukocytes, positive nitrite, many bacteria, greater than 50 WBCs.  UDS negative.  Chest x-ray with no acute cardiopulmonary disease process.  Abdominal x-ray with nonobstructive gas pattern.  Blood cultures x2 and urine cultures obtained.  Patient was started on empiric antibiotics with ceftriaxone and IV fluids.  TRH consulted for further evaluation management of altered mental status likely secondary to UTI with associated adult failure to thrive.  Assessment & Plan:   Acute metabolic encephalopathy Patient presenting to ED with increased confusion/decreased alertness  compared to her typical baseline.  Etiology likely multifactorial in the setting of dehydration, UTI as well as complicated by her advanced age and chronic comorbidities. --Continue IV fluid hydration, antibiotics, supportive care  E. coli septicemia, POA E. coli urinary tract infection, POA Urinalysis with large leukocytes, positive nitrite, many bacteria and greater than 50 WBCs.  Previous history of Klebsiella pneumonia and Pseudomonas.  Urine/Blood culture with E. coli with resistance to ampicillin/sulbactam, ciprofloxacin and Bactrim. --WBC 11.9>7.2>5.9 --De-escalate ceftriaxone to cefazolin 2 g IV q24h; can change to cefadroxil on discharge to complete 7-day antibiotic course  Oropharyngeal dysphagia Son reports increased issues with dysphagia over the last 1 year.  Has been on soft diet with nectar thickened liquids during this timeframe.  No formal speech therapy evaluation with instrumentation.  Seen by speech therapy, underwent modified barium swallow on 12/23/2021 --SLP following, appreciate assistance --Dysphagia 1 (Puree) solids;Honey thick liquids --Liquid Administration via: Cup;Spoon;No straw --Medication Administration: Crushed with puree --Supervision: Full supervision/cueing for compensatory strategies;Staff to assist with self feeding;Patient able to self feed --Compensations: Slow rate;Small sips/bites;Minimize environmental distractions --Postural Changes: Seated upright at 90 degrees;Remain semi-upright after after feeds/meals (Comment) (20-30 minutes) --Oral care BID;Oral care before and after PO --Aspiration precautions  Hypertensive urgency BP 180/92 on admission.  Home medications include amlodipine 2.5 mg p.o. daily. --Amlodipine increased to 5 mg p.o. daily for better blood pressure control --Hydralazine 10mg  IV q6h PRN SBP >165 or DBP >110  Elevated troponin High sensitive troponin elevated at 65 followed by 68, flat.  Etiology likely secondary to type II  demand  ischemia in the setting of hypertensive urgency versus urinary tract infection as above.  EKG with normal sinus rhythm, no concerning T wave inversions, no ST segment elevations/depressions.  TTE with LVEF 50-55%, LV low normal function, LV demonstrates regional wall motion normalities, grade 1 diastolic dysfunction, LA moderately dilated, moderate MR, moderate to severe AS.   Hypokalemia: Resolved Potassium 4.1 this morning --Repeat electrolytes in a.m.  Type 2 diabetes mellitus Hemoglobin A1c, 5.0; well controlled.  Diet controlled at baseline. --SSI for coverage --CBGs qAC/HS  GERD: Continue PPI  Weakness/deconditioning/debility: Patient currently a resident of [facility].  Has been wheelchair-bound apparently over the last 1 year per son's report.  But usually is able to eat on her own for the most part in the dining facility.  DNR. --PT/OT following --Plan to return to living facility when medically stable, TOC consulted  Advanced Alzheimer's dementia --Delirium precautions --Get up during the day --Encourage a familiar face to remain present throughout the day --Keep blinds open and lights on during daylight hours --Minimize the use of opioids/benzodiazepines  Skin tear, left pretibial area, POA Seen by wound care RN with recommendations. nonadherent (Xeroform) gauze topped with dry gauze, an ABD for cushioning and secured with a few turns of Kerlix roll gauze/paper tape changed daily. This is to be performed following a NS cleanse and gentle pat dry. A sacral silicone foam bordered dressing is to be placed for PI prevention and the patient turned and repositioned to minimize time in the supine position. Her bilateral heels are to be floated.  Severe protein calorie malnutrition, POA Body mass index is 19.23 kg/m. Nutrition Status: Nutrition Problem: Severe Malnutrition Etiology: chronic illness, dysphagia (dementia) Signs/Symptoms: severe fat  depletion, severe muscle depletion Interventions: Ensure Enlive (each supplement provides 350kcal and 20 grams of protein), MVI -- Dietitian consulted, encourage increased oral intake, supplementation  Adult failure to thrive Patient's son reports 20 pound weight loss over the last year since moving to a soft diet with nectar thickened liquids.  Etiology complicated likely by her advanced age and comorbidities as well as her oral pharyngeal dysphagia. --Nutrition consult for evaluation and supplementation recommendations    DVT prophylaxis: SCDs Start: 12/21/21 2054    Code Status: DNR Family Communication: No family present at bedside this morning, updated patient's son Jonny Ruiz via telephone this afternoon  Disposition Plan:  Level of care: Med-Surg Status is: Inpatient Remains inpatient appropriate because: IV antibiotics, anticipate likely ready for discharge back to SNF \\tomorrow     Consultants:  None  Procedures:  TTE  Antimicrobials:  Ceftriaxone 9/30 - 9/30, 10/2-10/2 Cefepime 10/1 - 10/1 Cefazolin 10/3>>   Subjective: Patient seen examined bedside, resting comfortably.  Alert but pleasantly confused, smiles to questions.  No family present at bedside this morning, the patient's son Jonny Ruiz via telephone this afternoon.  Unable to obtain any ROS from patient due to her underlying dementia and she is fairly nonverbal at baseline per son's report.  No acute concerns overnight per nursing staff.  Objective: Vitals:   12/23/21 2136 12/23/21 2321 12/24/21 0339 12/24/21 0758  BP: 139/66 (!) 146/73 (!) 167/83 (!) 156/72  Pulse: 68 67 70 72  Resp: (!) 24 (!) 24 (!) 24 (!) 24  Temp: 98.1 F (36.7 C) 98.2 F (36.8 C) 97.6 F (36.4 C) 98.2 F (36.8 C)  TempSrc: Oral Oral  Oral  SpO2: 100% 100% 100% 100%  Weight:      Height:        Intake/Output  Summary (Last 24 hours) at 12/24/2021 1336 Last data filed at 12/24/2021 1308 Gross per 24 hour  Intake 1911.15 ml  Output 1000  ml  Net 911.15 ml   Filed Weights   12/21/21 2049 12/21/21 2212  Weight: 48.1 kg 47.7 kg    Examination:  Physical Exam: GEN: NAD, alert, pleasantly confused HEENT: NCAT, PERRL, EOMI, sclera clear, dry mucous membranes PULM: CTAB w/o wheezes/crackles, normal respiratory effort, on room air CV: RRR w/o M/G/R GI: abd soft, NTND, NABS, + SEM RUSB MSK: no peripheral edema, Integumentary: dry/intact, no rashes or wounds    Data Reviewed: I have personally reviewed following labs and imaging studies  CBC: Recent Labs  Lab 12/21/21 1504 12/22/21 0828 12/23/21 0438 12/24/21 0510  WBC 11.9* 7.4 7.2 5.9  NEUTROABS 10.6*  --   --   --   HGB 12.7 11.3* 11.6* 11.6*  HCT 38.8 35.4* 36.2 35.7*  MCV 94.6 96.5 95.8 94.9  PLT 166 145* 176 167   Basic Metabolic Panel: Recent Labs  Lab 12/21/21 1504 12/21/21 2117 12/22/21 0828 12/23/21 0438 12/24/21 0510  NA 138  --  137 136 137  K 3.7  --  3.4* 4.1 4.1  CL 103  --  106 106 104  CO2 28  --  GLUCOSE 169*  --  109* 100* 105*  BUN 14  --  CREATININE 0.59  --  0.52 0.57 0.55  CALCIUM 8.9  --  8.1* 8.3* 8.5*  MG  --  2.0 1.8 1.9 1.7  PHOS  --  2.6 2.6 2.3*  --    GFR: Estimated Creatinine Clearance: 28.2 mL/min (by C-G formula based on SCr of 0.55 mg/dL). Liver Function Tests: Recent Labs  Lab 12/21/21 1504 12/22/21 0828  AST 22 16  ALT 15 13  ALKPHOS 88 72  BILITOT 1.0 0.9  PROT 7.3 6.1*  ALBUMIN 3.3* 2.7*   No results for input(s): "LIPASE", "AMYLASE" in the last 168 hours. No results for input(s): "AMMONIA" in the last 168 hours. Coagulation Profile: Recent Labs  Lab 12/21/21 1504  INR 1.1   Cardiac Enzymes: Recent Labs  Lab 12/21/21 2117  CKTOTAL 18*   BNP (last 3 results) No results for input(s): "PROBNP" in the last 8760 hours. HbA1C: Recent Labs    12/21/21 2117  HGBA1C 5.0   CBG: Recent Labs  Lab 12/23/21 1934 12/23/21 2324 12/24/21 0340 12/24/21 0820 12/24/21 1145   GLUCAP 117* 99 96 108* 134*   Lipid Profile: No results for input(s): "CHOL", "HDL", "LDLCALC", "TRIG", "CHOLHDL", "LDLDIRECT" in the last 72 hours. Thyroid Function Tests: Recent Labs    12/21/21 2117  TSH 1.085   Anemia Panel: No results for input(s): "VITAMINB12", "FOLATE", "FERRITIN", "TIBC", "IRON", "RETICCTPCT" in the last 72 hours. Sepsis Labs: Recent Labs  Lab 12/21/21 1504 12/21/21 1711 12/21/21 2117  PROCALCITON  --   --  <0.10  LATICACIDVEN 1.3 1.6  --     Recent Results (from the past 240 hour(s))  Culture, blood (routine x 2)     Status: Abnormal   Collection Time: 12/21/21  3:04 PM   Specimen: BLOOD  Result Value Ref Range Status   Specimen Description   Final    BLOOD Performed at [facility], [address] [place], [place] [address]    Special Requests   Final    NONE Performed at [facility], [address] [place], [place] [address]  Culture  Setup Time   Final    GRAM NEGATIVE RODS AEROBIC BOTTLE ONLY Organism ID to follow CRITICAL RESULT CALLED TO, READ BACK BY AND VERIFIED WITH: Clinton Sawyer. WILLIAMSON PHARMD, AT 1822 12/22/21 D. VANHOOK Performed at [facility], [address] [place], [place] [address]    Culture ESCHERICHIA COLI (A)  Final   Report Status 12/24/2021 FINAL  Final   Organism ID, Bacteria ESCHERICHIA COLI  Final      Susceptibility   Escherichia coli - MIC*    AMPICILLIN >=32 RESISTANT Resistant     CEFAZOLIN <=4 SENSITIVE Sensitive     CEFEPIME <=0.12 SENSITIVE Sensitive     CEFTAZIDIME <=1 SENSITIVE Sensitive     CEFTRIAXONE <=0.25 SENSITIVE Sensitive     CIPROFLOXACIN >=4 RESISTANT Resistant     GENTAMICIN <=1 SENSITIVE Sensitive     IMIPENEM <=0.25 SENSITIVE Sensitive     TRIMETH/SULFA >=320 RESISTANT Resistant     AMPICILLIN/SULBACTAM >=32 RESISTANT Resistant     PIP/TAZO <=4 SENSITIVE Sensitive     * ESCHERICHIA COLI  Blood Culture ID Panel (Reflexed)     Status: Abnormal    Collection Time: 12/21/21  3:04 PM  Result Value Ref Range Status   Enterococcus faecalis NOT DETECTED NOT DETECTED Final   Enterococcus Faecium NOT DETECTED NOT DETECTED Final   Listeria monocytogenes NOT DETECTED NOT DETECTED Final   Staphylococcus species NOT DETECTED NOT DETECTED Final   Staphylococcus aureus (BCID) NOT DETECTED NOT DETECTED Final   Staphylococcus epidermidis NOT DETECTED NOT DETECTED Final   Staphylococcus lugdunensis NOT DETECTED NOT DETECTED Final   Streptococcus species NOT DETECTED NOT DETECTED Final   Streptococcus agalactiae NOT DETECTED NOT DETECTED Final   Streptococcus pneumoniae NOT DETECTED NOT DETECTED Final   Streptococcus pyogenes NOT DETECTED NOT DETECTED Final   A.calcoaceticus-baumannii NOT DETECTED NOT DETECTED Final   Bacteroides fragilis NOT DETECTED NOT DETECTED Final   Enterobacterales DETECTED (A) NOT DETECTED Final    Comment: Enterobacterales represent a large order of gram negative bacteria, not a single organism. CRITICAL RESULT CALLED TO, READ BACK BY AND VERIFIED WITH: Clinton Sawyer. WILLIAMSON PHARMD, AT 1822 12/22/21 D. VANHOOK    Enterobacter cloacae complex NOT DETECTED NOT DETECTED Final   Escherichia coli DETECTED (A) NOT DETECTED Final    Comment: CRITICAL RESULT CALLED TO, READ BACK BY AND VERIFIED WITH: Clinton Sawyer. WILLIAMSON PHARMD, AT 1822 12/22/21 D. VANHOOK    Klebsiella aerogenes NOT DETECTED NOT DETECTED Final   Klebsiella oxytoca NOT DETECTED NOT DETECTED Final   Klebsiella pneumoniae NOT DETECTED NOT DETECTED Final   Proteus species NOT DETECTED NOT DETECTED Final   Salmonella species NOT DETECTED NOT DETECTED Final   Serratia marcescens NOT DETECTED NOT DETECTED Final   Haemophilus influenzae NOT DETECTED NOT DETECTED Final   Neisseria meningitidis NOT DETECTED NOT DETECTED Final   Pseudomonas aeruginosa NOT DETECTED NOT DETECTED Final   Stenotrophomonas maltophilia NOT DETECTED NOT DETECTED Final   Candida albicans NOT DETECTED NOT  DETECTED Final   Candida auris NOT DETECTED NOT DETECTED Final   Candida glabrata NOT DETECTED NOT DETECTED Final   Candida krusei NOT DETECTED NOT DETECTED Final   Candida parapsilosis NOT DETECTED NOT DETECTED Final   Candida tropicalis NOT DETECTED NOT DETECTED Final   Cryptococcus neoformans/gattii NOT DETECTED NOT DETECTED Final   CTX-M ESBL NOT DETECTED NOT DETECTED Final   Carbapenem resistance IMP NOT DETECTED NOT DETECTED Final   Carbapenem resistance KPC NOT DETECTED NOT DETECTED Final   Carbapenem resistance  NDM NOT DETECTED NOT DETECTED Final   Carbapenem resist OXA 48 LIKE NOT DETECTED NOT DETECTED Final   Carbapenem resistance VIM NOT DETECTED NOT DETECTED Final    Comment: Performed at [facility], [address], [place], [place] [address]  Culture, blood (routine x 2)     Status: None (Preliminary result)   Collection Time: 12/21/21  3:10 PM   Specimen: BLOOD  Result Value Ref Range Status   Specimen Description   Final    BLOOD RIGHT ANTECUBITAL Performed at [facility], [address] [place], [place] [address]    Special Requests   Final    BOTTLES DRAWN AEROBIC AND ANAEROBIC Blood Culture results may not be optimal due to an excessive volume of blood received in culture bottles Performed at [facility], [address] [place], [place] [address]    Culture   Final    NO GROWTH 3 DAYS Performed at [facility], [address] [place], [place] [address]    Report Status PENDING  Incomplete  Resp Panel by RT-PCR (Flu A&B, Covid) Anterior Nasal Swab     Status: None   Collection Time: 12/21/21  4:00 PM   Specimen: Anterior Nasal Swab  Result Value Ref Range Status   SARS Coronavirus 2 by RT PCR NEGATIVE NEGATIVE Final    Comment: (NOTE) SARS-CoV-2 target nucleic acids are NOT DETECTED.  The SARS-CoV-2 RNA is generally detectable in upper respiratory specimens during the acute phase of infection. The  lowest concentration of SARS-CoV-2 viral copies this assay can detect is 138 copies/mL. A negative result does not preclude SARS-Cov-2 infection and should not be used as the sole basis for treatment or other patient management decisions. A negative result may occur with  improper specimen collection/handling, submission of specimen other than nasopharyngeal swab, presence of viral mutation(s) within the areas targeted by this assay, and inadequate number of viral copies(<138 copies/mL). A negative result must be combined with clinical observations, patient history, and epidemiological information. The expected result is Negative.  Fact Sheet for Patients:  BloggerCourse.com  Fact Sheet for Healthcare Providers:  SeriousBroker.it  This test is no t yet approved or cleared by the [place] FDA and  has been authorized for detection and/or diagnosis of SARS-CoV-2 by FDA under an Emergency Use Authorization (EUA). This EUA will remain  in effect (meaning this test can be used) for the duration of the COVID-19 declaration under Section 564(b)(1) of the Act, 21 U.S.C.section 360bbb-3(b)(1), unless the authorization is terminated  or revoked sooner.       Influenza A by PCR NEGATIVE NEGATIVE Final   Influenza B by PCR NEGATIVE NEGATIVE Final    Comment: (NOTE) The Xpert Xpress SARS-CoV-2/FLU/RSV plus assay is intended as an aid in the diagnosis of influenza from Nasopharyngeal swab specimens and should not be used as a sole basis for treatment. Nasal washings and aspirates are unacceptable for Xpert Xpress SARS-CoV-2/FLU/RSV testing.  Fact Sheet for Patients: BloggerCourse.com  Fact Sheet for Healthcare Providers: SeriousBroker.it  This test is not yet approved or cleared by the [place] FDA and has been authorized for detection and/or diagnosis of SARS-CoV-2 by FDA under  an Emergency Use Authorization (EUA). This EUA will remain in effect (meaning this test can be used) for the duration of the COVID-19 declaration under Section 564(b)(1) of the Act, 21 U.S.C. section 360bbb-3(b)(1), unless the authorization is terminated or revoked.  Performed at [facility],  [address] [place], [place] [address]   Urine Culture     Status: Abnormal   Collection Time: 12/21/21  7:19 PM   Specimen: Urine, Clean Catch  Result Value Ref Range Status   Specimen Description   Final    URINE, CLEAN CATCH Performed at [facility], [address] [place], [place] [address]    Special Requests   Final    NONE Performed at [facility], [address] [place], [place] [address]    Culture >=100,000 COLONIES/mL ESCHERICHIA COLI (A)  Final   Report Status 12/24/2021 FINAL  Final   Organism ID, Bacteria ESCHERICHIA COLI (A)  Final      Susceptibility   Escherichia coli - MIC*    AMPICILLIN >=32 RESISTANT Resistant     CEFAZOLIN <=4 SENSITIVE Sensitive     CEFEPIME <=0.12 SENSITIVE Sensitive     CEFTRIAXONE <=0.25 SENSITIVE Sensitive     CIPROFLOXACIN >=4 RESISTANT Resistant     GENTAMICIN <=1 SENSITIVE Sensitive     IMIPENEM <=0.25 SENSITIVE Sensitive     NITROFURANTOIN <=16 SENSITIVE Sensitive     TRIMETH/SULFA >=320 RESISTANT Resistant     AMPICILLIN/SULBACTAM 16 INTERMEDIATE Intermediate     PIP/TAZO <=4 SENSITIVE Sensitive     * >=100,000 COLONIES/mL ESCHERICHIA COLI         Radiology Studies: DG Swallowing Func-Speech Pathology  Result Date: 12/23/2021 Table formatting from the original result was not included. Objective Swallowing Evaluation: Type of Study: MBS-Modified Barium Swallow Study  Patient Details Name: Anne Miller MRN: [id number] Date of Birth: 01/16/1919 Today's Date: 12/23/2021 Time: SLP Start Time (ACUTE ONLY): 0830 -SLP Stop Time (ACUTE ONLY): 0845 SLP Time Calculation (min)  (ACUTE ONLY): 15 min Past Medical History: Past Medical History: Diagnosis Date  Coronary artery disease   Dementia (HCC)   Diabetes mellitus without complication (HCC)   GERD (gastroesophageal reflux disease)   Hearing loss   Hyperlipidemia   Hypertension  Past Surgical History: No past surgical history on file. HPI: Pt is a 88 y.o. female who presented with change in mental status. CXR on admission negative and pt dx with UTI. PMH: dementia hypertension, GERD, HLD, CAD, type 2 diabetes. BSE 12/11/17: functional oropharyngeal swallow; rx regular/thin.  Subjective: pleasant, cooperative, largely non-verbal  Recommendations for follow up therapy are one component of a multi-disciplinary discharge planning process, led by the attending physician.  Recommendations may be updated based on patient status, additional functional criteria and insurance authorization. Assessment / Plan / Recommendation   12/23/2021  10:15 AM Clinical Impressions Clinical Impression Patient presents with a mild oral and a moderate pharyngeal phase dysphagia. Bolus consistencies tested during today's MBS were: puree/pudding thick, honey thick, nectar thick, thin.  During oral phase, she exhibited delayed anterior to posterior transit of liquids and solids, leading to premature spillage of puree solids and honey thick liquids into laryngeal vestibule. No significant delays in swallow initiation observed with any of the tested bolus consistencies.With thin liquids via spoon sip she exhibited penetration during the swallow which did not clear laryngeal vestibule and did lead to eventual aspiration. With thin liquids via cup sips, she exhibited consistent aspiration during the swallow as well as aspiration after the swallow from residuals still in laryngeal vestibule. With nectar thick liquids via spoon sip she exhibited trace penetration which did not clear and with cup sips of nectar thick liquids, she exhibited penetration during the swallow  which led to aspiration of penetrates after the  swallow. With honey thick liquids via spoon sip, no penetration or aspiration occured and no pharyngeal residuals. With small controlled cup sips, patient did have one incident of trace penetration which did not immediately clear, but eventually cleared laryngeal vestibule without any observed aspiration.Trace residuals observed in vallecular sinus, pyriform sinus s/p initial swallow of thin liquids but with honey thick, nectar thick and puree consistencies, no signficant amount of pharyngeal residuals observed. SLP observed patient to have instance of throat clearing and audible secretions when flurou was turned off but when turned back on, no observed barium contrast in pharyngeal or laryngeal areas. Suspect she patient was having penetration, possibly aspiration of her secretions. Patient is unable to follow any directions so no swallow strategies attempted. SLP is recommending downgrade diet to Dys 1(puree) solids, honey thick liquids due to her mod-severe risk of aspiration. Depending on GOC, may consider water protocol in between meals with thin liquid (only plain water) given by spoon or measured cup sip (Provale 5cc). SLP will follow for toleration, education with family and assistance in determining LRD.     12/23/2021  10:13 AM Treatment Recommendations Treatment Recommendations Therapy as outlined in treatment plan below     12/23/2021  10:15 AM Prognosis Prognosis for Safe Diet Advancement Fair Barriers to Reach Goals Cognitive deficits;Time post onset;Severity of deficits   12/23/2021  10:13 AM Diet Recommendations SLP Diet Recommendations Dysphagia 1 (Puree) solids;Honey thick liquids Liquid Administration via Cup;Spoon;No straw Medication Administration Crushed with puree Compensations Slow rate;Small sips/bites;Minimize environmental distractions Postural Changes Seated upright at 90 degrees;Remain semi-upright after after feeds/meals (Comment)      12/23/2021  10:13 AM Other Recommendations Oral Care Recommendations Oral care BID;Oral care before and after PO;Staff/trained caregiver to provide oral care Other Recommendations Order thickener from pharmacy Follow Up Recommendations Skilled nursing-short term rehab (<3 hours/day) Assistance recommended at discharge Frequent or constant Supervision/Assistance Functional Status Assessment Patient has had a recent decline in their functional status and demonstrates the ability to make significant improvements in function in a reasonable and predictable amount of time.   12/23/2021  10:13 AM Frequency and Duration  Speech Therapy Frequency (ACUTE ONLY) min 2x/week Treatment Duration 2 weeks     12/23/2021  10:02 AM Oral Phase Oral Phase Impaired Oral - Honey Teaspoon Reduced posterior propulsion;Premature spillage;Weak lingual manipulation Oral - Honey Cup Weak lingual manipulation;Premature spillage;Reduced posterior propulsion Oral - Nectar Teaspoon Weak lingual manipulation;Reduced posterior propulsion Oral - Nectar Cup Weak lingual manipulation;Reduced posterior propulsion Oral - Thin Teaspoon Weak lingual manipulation;Reduced posterior propulsion Oral - Thin Cup Weak lingual manipulation;Reduced posterior propulsion Oral - Puree Weak lingual manipulation;Reduced posterior propulsion;Premature spillage    12/23/2021  10:04 AM Pharyngeal Phase Pharyngeal Phase Impaired Pharyngeal Material does not enter airway Pharyngeal- Honey Cup Reduced airway/laryngeal closure;Penetration/Aspiration during swallow Pharyngeal Material enters airway, remains ABOVE vocal cords and not ejected out Pharyngeal- Nectar Teaspoon Penetration/Aspiration during swallow;Reduced airway/laryngeal closure Pharyngeal Material enters airway, remains ABOVE vocal cords and not ejected out Pharyngeal- Nectar Cup Reduced airway/laryngeal closure;Penetration/Aspiration during swallow;Penetration/Apiration after swallow Pharyngeal Material enters  airway, remains ABOVE vocal cords and not ejected out;Material enters airway, CONTACTS cords and not ejected out;Material enters airway, passes BELOW cords without attempt by patient to eject out (silent aspiration) Pharyngeal- Thin Teaspoon Reduced airway/laryngeal closure;Penetration/Apiration after swallow;Penetration/Aspiration during swallow;Pharyngeal residue - valleculae;Pharyngeal residue - pyriform Pharyngeal Material enters airway, passes BELOW cords without attempt by patient to eject out (silent aspiration);Material enters airway, remains ABOVE vocal cords and not ejected out Pharyngeal- Thin  Cup Penetration/Aspiration during swallow;Penetration/Apiration after swallow;Reduced airway/laryngeal closure;Pharyngeal residue - valleculae;Pharyngeal residue - pyriform;Pharyngeal residue - posterior pharnyx Pharyngeal Material enters airway, remains ABOVE vocal cords and not ejected out;Material enters airway, passes BELOW cords without attempt by patient to eject out (silent aspiration);Material enters airway, CONTACTS cords and not ejected out Pharyngeal- Puree [facility]    12/23/2021  10:12 AM Cervical Esophageal Phase  Cervical Esophageal Phase Impaired Cervical Esophageal Comment presence of cervical osteophytes (no radiologist present to confirm) which did appear to impede upon cervical esophagus Sonia Baller, MA, CCC-SLP Speech Therapy                          Scheduled Meds:  amLODipine  2.5 mg Oral Q breakfast   feeding supplement  237 mL Oral TID BM   insulin aspart  0-9 Units Subcutaneous TID WC   multivitamin with minerals  1 tablet Oral Daily   pantoprazole  40 mg Oral Daily   Continuous Infusions:  0.9 % NaCl with KCl 20 mEq / L 50 mL/hr at 12/24/21 0949    ceFAZolin (ANCEF) IV 2 g (12/24/21 1056)     LOS: 2 days    Time spent: 46 minutes spent on chart review, discussion with nursing staff, consultants, updating family and interview/physical exam; more than 50% of that time was  spent in counseling and/or coordination of care.    Dontel Harshberger J [place] DO [facility] Available via Epic secure chat 7am-7pm After these hours, please refer to coverage provider listed on [email or web address] 12/24/2021, 1:36 PM

## 2021-12-24 NOTE — Plan of Care (Signed)

## 2021-12-24 NOTE — Progress Notes (Signed)
   12/23/21 1939  Assess: MEWS Score  Temp 98.7 F (37.1 C)  BP (!) 156/77  MAP (mmHg) 101  Pulse Rate 86  Resp (!) 28  SpO2 94 %  Assess: MEWS Score  MEWS Temp 0  MEWS Systolic 0  MEWS Pulse 0  MEWS RR 2  MEWS LOC 0  MEWS Score 2  MEWS Score Color Yellow  Assess: if the MEWS score is Yellow or Red  Were vital signs taken at a resting state? Yes  Focused Assessment Change from prior assessment (see assessment flowsheet)  Does the patient meet 2 or more of the SIRS criteria? Yes  Does the patient have a confirmed or suspected source of infection? Yes  Provider and Rapid Response Notified? Yes  MEWS guidelines implemented *See Row Information* Yes  Treat  MEWS Interventions Other (Comment) (continue to assess)  Pain Scale 0-10  Pain Score 0  Take Vital Signs  Increase Vital Sign Frequency  Yellow: Q 2hr X 2 then Q 4hr X 2, if remains yellow, continue Q 4hrs  Escalate  MEWS: Escalate Yellow: discuss with charge nurse/RN and consider discussing with provider and RRT  Notify: Charge Nurse/RN  Name of Charge Nurse/RN Notified T Yazhini Mcaulay  Date Charge Nurse/RN Notified 12/23/21  Time Charge Nurse/RN Notified 2000  Notify: Provider  Provider Name/Title Olena Heckle  Date Provider Notified 12/23/21  Time Provider Notified 2013  Method of Notification Page  Notification Reason Change in status  Date of Provider Response 12/23/21  Time of Provider Response 2032  Document  Patient Outcome Stabilized after interventions (placed pt on 2l o2 as per NP rec)  Progress note created (see row info) Yes  Assess: SIRS CRITERIA  SIRS Temperature  0  SIRS Pulse 0  SIRS Respirations  1  SIRS WBC 1  SIRS Score Sum  2

## 2021-12-25 DIAGNOSIS — A4150 Gram-negative sepsis, unspecified: Secondary | ICD-10-CM

## 2021-12-25 LAB — GLUCOSE, CAPILLARY
Glucose-Capillary: 99 mg/dL (ref 70–99)
Glucose-Capillary: 114 mg/dL — ABNORMAL HIGH (ref 70–99)

## 2021-12-25 LAB — CBC
HCT: 32.8 % — ABNORMAL LOW (ref 36.0–46.0)
Hemoglobin: 10.3 g/dL — ABNORMAL LOW (ref 12.0–15.0)
MCH: 30.1 pg (ref 26.0–34.0)
MCHC: 31.4 g/dL (ref 30.0–36.0)
MCV: 95.9 fL (ref 80.0–100.0)
Platelets: 244 10*3/uL (ref 150–400)
RBC: 3.42 MIL/uL — ABNORMAL LOW (ref 3.87–5.11)
RDW: 13.2 % (ref 11.5–15.5)
WBC: 2.6 10*3/uL — ABNORMAL LOW (ref 4.0–10.5)
nRBC: 0 % (ref 0.0–0.2)

## 2021-12-25 LAB — BASIC METABOLIC PANEL
Anion gap: 8 (ref 5–15)
BUN: 12 mg/dL (ref 8–23)
CO2: 27 mmol/L (ref 22–32)
Calcium: 8.3 mg/dL — ABNORMAL LOW (ref 8.9–10.3)
Chloride: 101 mmol/L (ref 98–111)
Creatinine, Ser: 0.48 mg/dL (ref 0.44–1.00)
GFR, Estimated: >60 mL/min (ref >60)
Glucose, Bld: 97 mg/dL (ref 70–99)
Potassium: 4.2 mmol/L (ref 3.5–5.1)
Sodium: 136 mmol/L (ref 135–145)

## 2021-12-25 MED ORDER — CEFADROXIL 500 MG PO CAPS
500.0000 mg | ORAL_CAPSULE | Freq: Two times a day (BID) | ORAL | Status: DC
  Administered 2021-12-25: 500 mg via ORAL
  Filled 2021-12-25: qty 1

## 2021-12-25 MED ORDER — CEFADROXIL 500 MG PO CAPS: 500.0000 mg | ORAL_CAPSULE | Freq: Two times a day (BID) | ORAL | 0 refills | Status: AC

## 2021-12-25 MED ORDER — AMLODIPINE BESYLATE 5 MG PO TABS: 5.0000 mg | ORAL_TABLET | Freq: Every day | ORAL | 0 refills | Status: AC

## 2021-12-25 MED ORDER — ENSURE ENLIVE PO LIQD: 237.0000 mL | Freq: Three times a day (TID) | ORAL | 0 refills | Status: AC

## 2021-12-25 NOTE — Care Management Important Message (Signed)
Important Message  Patient Details IM Letter placed in Patient room for her Son. Name: Anne Miller MRN: [id number] Date of Birth: 1919-01-02   Medicare Important Message Given:  Yes     Kerin Salen 12/25/2021, 9:58 AM

## 2021-12-25 NOTE — Discharge Summary (Addendum)
Physician Discharge Summary  Anne Miller IRS:[id number] DOB: 01/14/1919 DOA: 12/21/2021  PCP: Pcp, No  Admit date: 12/21/2021 Discharge date: 12/25/2021 Recommendations for Outpatient Follow-up:  Follow up with PCP in 1 weeks-call for appointment Please obtain BMP/CBC in one week  Discharge Dispo:SNF Discharge Condition: Stable Code Status:   Code Status: DNR Diet recommendation:  Diet Orders (From admission, onward)     Start     Ordered   12/25/21 1251  DIET - DYS 1 Room service appropriate? Yes; Fluid consistency: Nectar Thick  Diet effective now       Comments: Wilhemina Bonito Protocol  Question Answer Comment  Room service appropriate? Yes   Fluid consistency: Nectar Thick      12/25/21 1251              Brief/Interim Summary: 88 y.o. female with past medical history significant for dementia, nonverbal at baseline, essential hypertension, HLD, CAD, diet-controlled DM2, GERD, oropharyngeal dysphagia who presented from [facility] ED on 9/30 with altered mental status, decreased oral intake/adult failure to thrive.  Due to her nonverbal status, advanced Alzheimer's disease majority of HPI obtained from son, ED physician and notes in EMR.  Patient's son who sees his mother daily noted that she appears to be more confused than her typical baseline with decreased oral intake over the last 36 hours.  Also she is not as alert as she typically is and at baseline utilizes a wheelchair but is able to feed herself for the most part in the dining facility and this has been now an acute change over the last 3 days. In the ED, temperature 97.7 F, HR 105, RR 21, BP 180/92, SPO2 96% on room air.  WBC 11.9, hemoglobin 12.7, platelets 166.  Sodium 138, potassium 3.7, chloride 103, CO2 28, BUN 14, creatinine 0.59.  Glucose 169.  AST 22, ALT 215, total bilirubin 1.0.  High sensitive troponin 65>68.  Lactic acid 1.6.  CK18.  TSH 1.085.  COVID-19 PCR negative.  Influenza A/B  PCR negative.  Urinalysis with large leukocytes, positive nitrite, many bacteria, greater than 50 WBCs.  UDS negative.  Chest x-ray with no acute cardiopulmonary disease process.  Abdominal x-ray with nonobstructive gas pattern.  Blood cultures x2 and urine cultures obtained.  Patient was started on empiric antibiotics with ceftriaxone and IV fluids.  TRH consulted for further evaluation management of altered mental status likely secondary to UTI with associated adult failure to thrive. Patient was treated for acute metabolic encephalopathy with E. coli sepsis E. coli UTI oropharyngeal dysphagia, hypertensive urgency elevated troponin likely type II demand ischemia from hypertensive urgency versus urinary infection, weakness debility deconditioning At this time she is clinically improved afebrile Patient got treated with antibiotic and plan is for discharge on oral antibiotic to complete 7 days course, seems medically stable for discharge back to facility 12/25/2021 SPOKE W/ SO and okay for return to SNF today  Discharge Diagnoses:  Principal Problem:   Septicemia due to Gram negative organism [facility]) Active Problems:   Dementia (HCC)   Esophageal reflux   Hyperlipidemia with target LDL less than 100   Atherosclerotic heart disease of native coronary artery without angina pectoris   Type 2 diabetes mellitus without complication (HCC)   UTI (urinary tract infection)   Elevated troponin   Sepsis (HCC)   Protein-calorie malnutrition, severe  Acute metabolic encephalopathy w/ increased confusion/decreased alertness compared to her typical baseline w/ dementia.Etiology likely multifactorial in the setting of dehydration,  UTI as well as complicated by her advanced age and chronic comorbidities.  Treated with IV fluids antibiotics supportive care at this time mental status improved back to baseline dementia.  Eating well.   E. coli septicemia, POA E. coli urinary tract infection, POA Septicemia source  from UTI.  Will improve changing to oral antibiotics to complete total 7 days or so of course.  Seen in 1 week Recent Labs  Lab 12/21/21 1504 12/21/21 1711 12/21/21 2117 12/22/21 0828 12/23/21 0438 12/24/21 0510 12/25/21 0824  WBC 11.9*  --   --  7.4 7.2 5.9 2.6*  LATICACIDVEN 1.3 1.6  --   --   --   --   --   PROCALCITON  --   --  <0.10  --   --   --   --      Oropharyngeal dysphagia:Son reported increased issues with dysphagia over the last 1 year.  Has been on soft diet with nectar thickened liquids during this timeframe.  No formal speech therapy evaluation with instrumentation.  Seen by speech therapy, underwent modified barium swallow on 12/23/2021: Recommendation provided for diet as below Diet Orders (From admission, onward)     Start     Ordered   12/25/21 1251  DIET - DYS 1 Room service appropriate? Yes; Fluid consistency: Nectar Thick  Diet effective now       Comments: Wilhemina Bonito Protocol  Question Answer Comment  Room service appropriate? Yes   Fluid consistency: Nectar Thick      12/25/21 1251              Hypertensive urgency:BP 180/92 on admission.  Home medications include amlodipine 2.5 mg p.o. daily> increased to 5 mg p.o. daily for better blood pressure control.  Continue outpatient follow-up to optimize   Elevated troponin/demand ischemia due to uncontrolled blood pressure. Troponins- 65 followed by 68, flat.  Etiology likely secondary to type II demand ischemia in the setting of hypertensive urgency versus urinary tract infection as above.  EKG with normal sinus rhythm, no concerning T wave inversions, no ST segment elevations/depressions.  TTE with LVEF 50-55%, LV low normal function, LV demonstrates regional wall motion normalities, grade 1 diastolic dysfunction, LA moderately dilated, moderate MR, moderate to severe AS.    Hypokalemia: Resolved  Type 2 diabetes mellitus: Resume home meds A1c 5.0 if hypoglycemia consider discontinuing home meds  instruction provided   GERD: Continue PPI Weakness/deconditioning/debility: Patient currently a resident of [facility].  Has been wheelchair-bound apparently over the last 1 year per son's report.  But usually is able to eat on her own for the most part in the dining facility.  DNR. --PT/OT following --Plan to return to living facility when medically stable, TOC consulted   Advanced Alzheimer's dementia --Delirium precautions --Get up during the day --Encourage a familiar face to remain present throughout the day --Keep blinds open and lights on during daylight hours --Minimize the use of opioids/benzodiazepines   Skin tear, left pretibial area, POA Seen by wound care RN with recommendations. nonadherent (Xeroform) gauze topped with dry gauze, an ABD for cushioning and secured with a few turns of Kerlix roll gauze/paper tape changed daily. This is to be performed following a NS cleanse and gentle pat dry. A sacral silicone foam bordered dressing is to be placed for PI prevention and the patient turned and repositioned to minimize time in the supine position. Her bilateral heels are to be floated.   Severe protein calorie malnutrition,  POA Body mass index is 19.23 kg/m. Nutrition Status: Nutrition Problem: Severe Malnutrition Etiology: chronic illness, dysphagia (dementia) Signs/Symptoms: severe fat depletion, severe muscle depletion Interventions: Ensure Enlive (each supplement provides 350kcal and 20 grams of protein), MVI -- Dietitian consulted, encourage increased oral intake, supplementation   Adult failure to thrive Patient's son reports 20 pound weight loss over the last year since moving to a soft diet with nectar thickened liquids.  Etiology complicated likely by her advanced age and comorbidities as well as her oral pharyngeal dysphagia. --Nutrition consult for evaluation and supplementation recommendations   Consults: NONE Subjective: Alert awake  resting comfortably able to smile with hand after waking up. Son was present at the bedside  Discharge Exam: Vitals:   12/24/21 1918 12/25/21 0434  BP: (!) 152/65 (!) 173/83  Pulse: 69 72  Resp: 14 16  Temp: 98.1 F (36.7 C) 98.2 F (36.8 C)  SpO2: 100% 100%   General: Pt is alert, awake, not in acute distress Cardiovascular: RRR, S1/S2 +, no rubs, no gallops Respiratory: CTA bilaterally, no wheezing, no rhonchi Abdominal: Soft, NT, ND, bowel sounds + Extremities: no edema, no cyanosis  Discharge Instructions  Discharge Instructions     Discharge instructions   Complete by: As directed    Please call call MD or return to ER for similar or worsening recurring problem that brought you to hospital or if any fever,nausea/vomiting,abdominal pain, uncontrolled pain, chest pain,  shortness of breath or any other alarming symptoms.  Please follow-up your doctor as instructed in a week time and call the office for appointment.  Please avoid alcohol, smoking, or any other illicit substance and maintain healthy habits including taking your regular medications as prescribed.  You were cared for by a hospitalist during your hospital stay. If you have any questions about your discharge medications or the care you received while you were in the hospital after you are discharged, you can call the unit and ask to speak with the hospitalist on call if the hospitalist that took care of you is not available.  Once you are discharged, your primary care physician will handle any further medical issues. Please note that NO REFILLS for any discharge medications will be authorized once you are discharged, as it is imperative that you return to your primary care physician (or establish a relationship with a primary care physician if you do not have one) for your aftercare needs so that they can reassess your need for medications and monitor your lab values   Discharge wound care:   Complete by: As  directed    Comments: Wound care to left pretibial area skin tear:  Cleanse with NS, pat gently dry. Cover with folded layers of xeroform gauze [employer] [place] # [id number]), top with dry gauze, ABD and secure with a few turns of Kerlix roll gauze/paper tape. Change daily.   Increase activity slowly   Complete by: As directed       Allergies as of 12/25/2021       Reactions   Ativan [lorazepam] Other (See Comments)   Caused delirium- "Allergic," per paperwork from facility   Lactose Intolerance (gi) Diarrhea, Nausea And Vomiting, Other (See Comments)   "Allergic," per paperwork from facility   Seroquel [quetiapine Fumarate] Other (See Comments)   Caused delirium- "Allergic," per paperwork from facility   Sulfa Antibiotics Other (See Comments)   "Allergic," per paperwork from facility   Zoloft [sertraline Hcl] Other (See Comments)   Caused delirium- "Allergic," per paperwork from facility  Medication List     TAKE these medications    amLODipine 5 MG tablet Commonly known as: NORVASC Take 1 tablet (5 mg total) by mouth daily with breakfast. Start taking on: December 26, 2021 What changed:  medication strength how much to take when to take this   Aquaphor Adv Therapy Healing Oint Apply 1 application  topically See admin instructions. Apply to arms and legs every shift after a bath   Boost VHC Liqd Take 120 mLs by mouth 2 (two) times daily. What changed: Another medication with the same name was changed. Make sure you understand how and when to take each.   feeding supplement Liqd Take 237 mLs by mouth 3 (three) times daily between meals. What changed:  how much to take when to take this   cefadroxil 500 MG capsule Commonly known as: DURICEF Take 1 capsule (500 mg total) by mouth 2 (two) times daily for 4 days.   Multivitamin Adult Tabs Take 1 tablet by mouth daily.   OXYGEN Inhale into the lungs as needed (to keep sats at >90%).   pantoprazole sodium 40 mg Commonly  known as: PROTONIX Take 40 mg by mouth daily.   Vitamin D3 25 MCG (1000 UT) Caps Take 1,000 Units by mouth in the morning.               Discharge Care Instructions  (From admission, onward)           Start     Ordered   12/25/21 0000  Discharge wound care:       Comments: Comments: Wound care to left pretibial area skin tear:  Cleanse with NS, pat gently dry. Cover with folded layers of xeroform gauze [place] # [id number]), top with dry gauze, ABD and secure with a few turns of Kerlix roll gauze/paper tape. Change daily.   12/25/21 1012            Allergies  Allergen Reactions   Ativan [Lorazepam] Other (See Comments)    Caused delirium- "Allergic," per paperwork from facility   Lactose Intolerance (Gi) Diarrhea, Nausea And Vomiting and Other (See Comments)    "Allergic," per paperwork from facility   Seroquel [Quetiapine Fumarate] Other (See Comments)    Caused delirium- "Allergic," per paperwork from facility   Sulfa Antibiotics Other (See Comments)    "Allergic," per paperwork from facility   Zoloft [Sertraline Hcl] Other (See Comments)    Caused delirium- "Allergic," per paperwork from facility    The results of significant diagnostics from this hospitalization (including imaging, microbiology, ancillary and laboratory) are listed below for reference.    Microbiology: Recent Results (from the past 240 hour(s))  Culture, blood (routine x 2)     Status: Abnormal   Collection Time: 12/21/21  3:04 PM   Specimen: BLOOD  Result Value Ref Range Status   Specimen Description   Final    BLOOD Performed at [facility], [address] [place], [place] [address]    Special Requests   Final    NONE Performed at [facility], [address], [place], [place] [address]    Culture  Setup Time   Final    GRAM NEGATIVE RODS AEROBIC BOTTLE ONLY Organism ID to follow CRITICAL RESULT CALLED TO, READ BACK BY AND VERIFIED WITH: EClinton Sawyer PHARMD, AT 1822 12/22/21 D. VANHOOK Performed at [facility], [address] [place], [place] [address]    Culture ESCHERICHIA COLI (A)  Final  Report Status 12/24/2021 FINAL  Final   Organism ID, Bacteria ESCHERICHIA COLI  Final      Susceptibility   Escherichia coli - MIC*    AMPICILLIN >=32 RESISTANT Resistant     CEFAZOLIN <=4 SENSITIVE Sensitive     CEFEPIME <=0.12 SENSITIVE Sensitive     CEFTAZIDIME <=1 SENSITIVE Sensitive     CEFTRIAXONE <=0.25 SENSITIVE Sensitive     CIPROFLOXACIN >=4 RESISTANT Resistant     GENTAMICIN <=1 SENSITIVE Sensitive     IMIPENEM <=0.25 SENSITIVE Sensitive     TRIMETH/SULFA >=320 RESISTANT Resistant     AMPICILLIN/SULBACTAM >=32 RESISTANT Resistant     PIP/TAZO <=4 SENSITIVE Sensitive     * ESCHERICHIA COLI  Blood Culture ID Panel (Reflexed)     Status: Abnormal   Collection Time: 12/21/21  3:04 PM  Result Value Ref Range Status   Enterococcus faecalis NOT DETECTED NOT DETECTED Final   Enterococcus Faecium NOT DETECTED NOT DETECTED Final   Listeria monocytogenes NOT DETECTED NOT DETECTED Final   Staphylococcus species NOT DETECTED NOT DETECTED Final   Staphylococcus aureus (BCID) NOT DETECTED NOT DETECTED Final   Staphylococcus epidermidis NOT DETECTED NOT DETECTED Final   Staphylococcus lugdunensis NOT DETECTED NOT DETECTED Final   Streptococcus species NOT DETECTED NOT DETECTED Final   Streptococcus agalactiae NOT DETECTED NOT DETECTED Final   Streptococcus pneumoniae NOT DETECTED NOT DETECTED Final   Streptococcus pyogenes NOT DETECTED NOT DETECTED Final   A.calcoaceticus-baumannii NOT DETECTED NOT DETECTED Final   Bacteroides fragilis NOT DETECTED NOT DETECTED Final   Enterobacterales DETECTED (A) NOT DETECTED Final    Comment: Enterobacterales represent a large order of gram negative bacteria, not a single organism. CRITICAL RESULT CALLED TO, READ BACK BY AND VERIFIED WITH: EClinton Sawyer PHARMD, AT 1822 12/22/21 D.  VANHOOK    Enterobacter cloacae complex NOT DETECTED NOT DETECTED Final   Escherichia coli DETECTED (A) NOT DETECTED Final    Comment: CRITICAL RESULT CALLED TO, READ BACK BY AND VERIFIED WITH: EClinton Sawyer PHARMD, AT 1822 12/22/21 D. VANHOOK    Klebsiella aerogenes NOT DETECTED NOT DETECTED Final   Klebsiella oxytoca NOT DETECTED NOT DETECTED Final   Klebsiella pneumoniae NOT DETECTED NOT DETECTED Final   Proteus species NOT DETECTED NOT DETECTED Final   Salmonella species NOT DETECTED NOT DETECTED Final   Serratia marcescens NOT DETECTED NOT DETECTED Final   Haemophilus influenzae NOT DETECTED NOT DETECTED Final   Neisseria meningitidis NOT DETECTED NOT DETECTED Final   Pseudomonas aeruginosa NOT DETECTED NOT DETECTED Final   Stenotrophomonas maltophilia NOT DETECTED NOT DETECTED Final   Candida albicans NOT DETECTED NOT DETECTED Final   Candida auris NOT DETECTED NOT DETECTED Final   Candida glabrata NOT DETECTED NOT DETECTED Final   Candida krusei NOT DETECTED NOT DETECTED Final   Candida parapsilosis NOT DETECTED NOT DETECTED Final   Candida tropicalis NOT DETECTED NOT DETECTED Final   Cryptococcus neoformans/gattii NOT DETECTED NOT DETECTED Final   CTX-M ESBL NOT DETECTED NOT DETECTED Final   Carbapenem resistance IMP NOT DETECTED NOT DETECTED Final   Carbapenem resistance KPC NOT DETECTED NOT DETECTED Final   Carbapenem resistance NDM NOT DETECTED NOT DETECTED Final   Carbapenem resist OXA 48 LIKE NOT DETECTED NOT DETECTED Final   Carbapenem resistance VIM NOT DETECTED NOT DETECTED Final    Comment: Performed at [facility], [address] [place], [place] [address]  Culture, blood (routine x 2)     Status: None (Preliminary result)   Collection Time:  12/21/21  3:10 PM   Specimen: BLOOD  Result Value Ref Range Status   Specimen Description   Final    BLOOD RIGHT ANTECUBITAL Performed at [facility], [address] [place], [place] [address]     Special Requests   Final    BOTTLES DRAWN AEROBIC AND ANAEROBIC Blood Culture results may not be optimal due to an excessive volume of blood received in culture bottles Performed at [facility], [address] [place], [place] [address]    Culture   Final    NO GROWTH 4 DAYS Performed at [facility], [address] [place], [place] [address]    Report Status PENDING  Incomplete  Resp Panel by RT-PCR (Flu A&B, Covid) Anterior Nasal Swab     Status: None   Collection Time: 12/21/21  4:00 PM   Specimen: Anterior Nasal Swab  Result Value Ref Range Status   SARS Coronavirus 2 by RT PCR NEGATIVE NEGATIVE Final    Comment: (NOTE) SARS-CoV-2 target nucleic acids are NOT DETECTED.  The SARS-CoV-2 RNA is generally detectable in upper respiratory specimens during the acute phase of infection. The lowest concentration of SARS-CoV-2 viral copies this assay can detect is 138 copies/mL. A negative result does not preclude SARS-Cov-2 infection and should not be used as the sole basis for treatment or other patient management decisions. A negative result may occur with  improper specimen collection/handling, submission of specimen other than nasopharyngeal swab, presence of viral mutation(s) within the areas targeted by this assay, and inadequate number of viral copies(<138 copies/mL). A negative result must be combined with clinical observations, patient history, and epidemiological information. The expected result is Negative.  Fact Sheet for Patients:  BloggerCourse.comhttps://www.fda.gov/media/152166/download  Fact Sheet for Healthcare Providers:  SeriousBroker.ithttps://www.fda.gov/media/152162/download  This test is no t yet approved or cleared by the [place] FDA and  has been authorized for detection and/or diagnosis of SARS-CoV-2 by FDA under an Emergency Use Authorization (EUA). This EUA will remain  in effect (meaning this test can be used) for the duration of the COVID-19  declaration under Section 564(b)(1) of the Act, 21 U.S.C.section 360bbb-3(b)(1), unless the authorization is terminated  or revoked sooner.       Influenza A by PCR NEGATIVE NEGATIVE Final   Influenza B by PCR NEGATIVE NEGATIVE Final    Comment: (NOTE) The Xpert Xpress SARS-CoV-2/FLU/RSV plus assay is intended as an aid in the diagnosis of influenza from Nasopharyngeal swab specimens and should not be used as a sole basis for treatment. Nasal washings and aspirates are unacceptable for Xpert Xpress SARS-CoV-2/FLU/RSV testing.  Fact Sheet for Patients: BloggerCourse.comhttps://www.fda.gov/media/152166/download  Fact Sheet for Healthcare Providers: SeriousBroker.ithttps://www.fda.gov/media/152162/download  This test is not yet approved or cleared by the [place] FDA and has been authorized for detection and/or diagnosis of SARS-CoV-2 by FDA under an Emergency Use Authorization (EUA). This EUA will remain in effect (meaning this test can be used) for the duration of the COVID-19 declaration under Section 564(b)(1) of the Act, 21 U.S.C. section 360bbb-3(b)(1), unless the authorization is terminated or revoked.  Performed at [facility], [address] [place], [place] [address]   Urine Culture     Status: Abnormal   Collection Time: 12/21/21  7:19 PM   Specimen: Urine, Clean Catch  Result Value Ref Range Status   Specimen Description   Final    URINE, CLEAN CATCH Performed at [facility], [address] [place], [place] [address]  Special Requests   Final    NONE Performed at [facility], [address], [place], [place] [address]    Culture >=100,000 COLONIES/mL ESCHERICHIA COLI (A)  Final   Report Status 12/24/2021 FINAL  Final   Organism ID, Bacteria ESCHERICHIA COLI (A)  Final      Susceptibility   Escherichia coli - MIC*    AMPICILLIN >=32 RESISTANT Resistant     CEFAZOLIN <=4 SENSITIVE Sensitive     CEFEPIME <=0.12 SENSITIVE  Sensitive     CEFTRIAXONE <=0.25 SENSITIVE Sensitive     CIPROFLOXACIN >=4 RESISTANT Resistant     GENTAMICIN <=1 SENSITIVE Sensitive     IMIPENEM <=0.25 SENSITIVE Sensitive     NITROFURANTOIN <=16 SENSITIVE Sensitive     TRIMETH/SULFA >=320 RESISTANT Resistant     AMPICILLIN/SULBACTAM 16 INTERMEDIATE Intermediate     PIP/TAZO <=4 SENSITIVE Sensitive     * >=100,000 COLONIES/mL ESCHERICHIA COLI    Procedures/Studies: DG Swallowing Func-Speech Pathology  Result Date: 12/23/2021 Table formatting from the original result was not included. Objective Swallowing Evaluation: Type of Study: MBS-Modified Barium Swallow Study  Patient Details Name: Anne Miller MRN: [id number] Date of Birth: 10/12/1918 Today's Date: 12/23/2021 Time: SLP Start Time (ACUTE ONLY): 0830 -SLP Stop Time (ACUTE ONLY): 0845 SLP Time Calculation (min) (ACUTE ONLY): 15 min Past Medical History: Past Medical History: Diagnosis Date  Coronary artery disease   Dementia (HCC)   Diabetes mellitus without complication (HCC)   GERD (gastroesophageal reflux disease)   Hearing loss   Hyperlipidemia   Hypertension  Past Surgical History: No past surgical history on file. HPI: Pt is a 88 y.o. female who presented with change in mental status. CXR on admission negative and pt dx with UTI. PMH: dementia hypertension, GERD, HLD, CAD, type 2 diabetes. BSE 12/11/17: functional oropharyngeal swallow; rx regular/thin.  Subjective: pleasant, cooperative, largely non-verbal  Recommendations for follow up therapy are one component of a multi-disciplinary discharge planning process, led by the attending physician.  Recommendations may be updated based on patient status, additional functional criteria and insurance authorization. Assessment / Plan / Recommendation   12/23/2021  10:15 AM Clinical Impressions Clinical Impression Patient presents with a mild oral and a moderate pharyngeal phase dysphagia. Bolus consistencies tested during today's MBS were:  puree/pudding thick, honey thick, nectar thick, thin.  During oral phase, she exhibited delayed anterior to posterior transit of liquids and solids, leading to premature spillage of puree solids and honey thick liquids into laryngeal vestibule. No significant delays in swallow initiation observed with any of the tested bolus consistencies.With thin liquids via spoon sip she exhibited penetration during the swallow which did not clear laryngeal vestibule and did lead to eventual aspiration. With thin liquids via cup sips, she exhibited consistent aspiration during the swallow as well as aspiration after the swallow from residuals still in laryngeal vestibule. With nectar thick liquids via spoon sip she exhibited trace penetration which did not clear and with cup sips of nectar thick liquids, she exhibited penetration during the swallow which led to aspiration of penetrates after the swallow. With honey thick liquids via spoon sip, no penetration or aspiration occured and no pharyngeal residuals. With small controlled cup sips, patient did have one incident of trace penetration which did not immediately clear, but eventually cleared laryngeal vestibule without any observed aspiration.Trace residuals observed in vallecular sinus, pyriform sinus s/p initial swallow of thin liquids but with honey thick, nectar thick and puree consistencies, no signficant amount of pharyngeal residuals  observed. SLP observed patient to have instance of throat clearing and audible secretions when flurou was turned off but when turned back on, no observed barium contrast in pharyngeal or laryngeal areas. Suspect she patient was having penetration, possibly aspiration of her secretions. Patient is unable to follow any directions so no swallow strategies attempted. SLP is recommending downgrade diet to Dys 1(puree) solids, honey thick liquids due to her mod-severe risk of aspiration. Depending on GOC, may consider water protocol in between  meals with thin liquid (only plain water) given by spoon or measured cup sip (Provale 5cc). SLP will follow for toleration, education with family and assistance in determining LRD.     12/23/2021  10:13 AM Treatment Recommendations Treatment Recommendations Therapy as outlined in treatment plan below     12/23/2021  10:15 AM Prognosis Prognosis for Safe Diet Advancement Fair Barriers to Reach Goals Cognitive deficits;Time post onset;Severity of deficits   12/23/2021  10:13 AM Diet Recommendations SLP Diet Recommendations Dysphagia 1 (Puree) solids;Honey thick liquids Liquid Administration via Cup;Spoon;No straw Medication Administration Crushed with puree Compensations Slow rate;Small sips/bites;Minimize environmental distractions Postural Changes Seated upright at 90 degrees;Remain semi-upright after after feeds/meals (Comment)     12/23/2021  10:13 AM Other Recommendations Oral Care Recommendations Oral care BID;Oral care before and after PO;Staff/trained caregiver to provide oral care Other Recommendations Order thickener from pharmacy Follow Up Recommendations Skilled nursing-short term rehab (<3 hours/day) Assistance recommended at discharge Frequent or constant Supervision/Assistance Functional Status Assessment Patient has had a recent decline in their functional status and demonstrates the ability to make significant improvements in function in a reasonable and predictable amount of time.   12/23/2021  10:13 AM Frequency and Duration  Speech Therapy Frequency (ACUTE ONLY) min 2x/week Treatment Duration 2 weeks     12/23/2021  10:02 AM Oral Phase Oral Phase Impaired Oral - Honey Teaspoon Reduced posterior propulsion;Premature spillage;Weak lingual manipulation Oral - Honey Cup Weak lingual manipulation;Premature spillage;Reduced posterior propulsion Oral - Nectar Teaspoon Weak lingual manipulation;Reduced posterior propulsion Oral - Nectar Cup Weak lingual manipulation;Reduced posterior propulsion Oral - Thin  Teaspoon Weak lingual manipulation;Reduced posterior propulsion Oral - Thin Cup Weak lingual manipulation;Reduced posterior propulsion Oral - Puree Weak lingual manipulation;Reduced posterior propulsion;Premature spillage    12/23/2021  10:04 AM Pharyngeal Phase Pharyngeal Phase Impaired Pharyngeal Material does not enter airway Pharyngeal- Honey Cup Reduced airway/laryngeal closure;Penetration/Aspiration during swallow Pharyngeal Material enters airway, remains ABOVE vocal cords and not ejected out Pharyngeal- Nectar Teaspoon Penetration/Aspiration during swallow;Reduced airway/laryngeal closure Pharyngeal Material enters airway, remains ABOVE vocal cords and not ejected out Pharyngeal- Nectar Cup Reduced airway/laryngeal closure;Penetration/Aspiration during swallow;Penetration/Apiration after swallow Pharyngeal Material enters airway, remains ABOVE vocal cords and not ejected out;Material enters airway, CONTACTS cords and not ejected out;Material enters airway, passes BELOW cords without attempt by patient to eject out (silent aspiration) Pharyngeal- Thin Teaspoon Reduced airway/laryngeal closure;Penetration/Apiration after swallow;Penetration/Aspiration during swallow;Pharyngeal residue - valleculae;Pharyngeal residue - pyriform Pharyngeal Material enters airway, passes BELOW cords without attempt by patient to eject out (silent aspiration);Material enters airway, remains ABOVE vocal cords and not ejected out Pharyngeal- Thin Cup Penetration/Aspiration during swallow;Penetration/Apiration after swallow;Reduced airway/laryngeal closure;Pharyngeal residue - valleculae;Pharyngeal residue - pyriform;Pharyngeal residue - posterior pharnyx Pharyngeal Material enters airway, remains ABOVE vocal cords and not ejected out;Material enters airway, passes BELOW cords without attempt by patient to eject out (silent aspiration);Material enters airway, CONTACTS cords and not ejected out Pharyngeal- Puree [facility]    12/23/2021  10:12  AM Cervical Esophageal Phase  Cervical Esophageal Phase Impaired Cervical  Esophageal Comment presence of cervical osteophytes (no radiologist present to confirm) which did appear to impede upon cervical esophagus Sonia Baller, MA, CCC-SLP Speech Therapy                     ECHOCARDIOGRAM COMPLETE  Result Date: 12/22/2021    ECHOCARDIOGRAM REPORT   Patient Name:   [facility] Date of Exam: 12/22/2021 Medical Rec #:  [id number]      Height:       62.0 in Accession #:    [id number]     Weight:       105.2 lb Date of Birth:  03/18/1919     BSA:          1.455 m Patient Age:    89 years      BP:           128/60 mmHg Patient Gender: F              HR:           84 bpm. Exam Location:  Inpatient Procedure: 2D Echo, Cardiac Doppler and Color Doppler Indications:    Elevated troponin  History:        Patient has prior history of Echocardiogram examinations, most                 recent 12/11/2017. CAD; Risk Factors:Hypertension, Diabetes and                 Dyslipidemia.  Sonographer:    Jefferey Pica Referring Phys: Barn[place]  1. Left ventricular ejection fraction, by estimation, is 50 to 55%. The left ventricle has low normal function. The left ventricle demonstrates regional wall motion abnormalities (see scoring diagram/findings for description). Left ventricular diastolic  parameters are consistent with Grade I diastolic dysfunction (impaired relaxation).  2. Right ventricular systolic function is normal. The right ventricular size is normal. There is moderately elevated pulmonary artery systolic pressure. The estimated right ventricular systolic pressure is 25.8 mmHg.  3. Left atrial size was mild to moderately dilated.  4. The mitral valve is abnormal. Mild to moderate mitral valve regurgitation.  5. The aortic valve is tricuspid. There is moderate to severe calcification of the aortic valve. Aortic valve regurgitation is trivial. Moderate to severe aortic valve stenosis, upper  end of scale. Aortic valve mean gradient measures 31.0 mmHg. Aortic valve Vmax measures 3.45 m/s. Dimentionless index 0.25.  6. The inferior vena cava is normal in size with <50% respiratory variability, suggesting right atrial pressure of 8 mmHg. Comparison(s): Prior images unable to be directly viewed, comparison made by report only. FINDINGS  Left Ventricle: Left ventricular ejection fraction, by estimation, is 50 to 55%. The left ventricle has low normal function. The left ventricle demonstrates regional wall motion abnormalities. The left ventricular internal cavity size was normal in size. There is borderline left ventricular hypertrophy. Left ventricular diastolic parameters are consistent with Grade I diastolic dysfunction (impaired relaxation).  LV Wall Scoring: The mid and distal anterior septum, apical anterior segment, apical inferior segment, and apex are hypokinetic. The anterior wall, entire lateral wall, inferior wall, basal anteroseptal segment, mid inferoseptal segment, and basal inferoseptal segment are normal. Right Ventricle: The right ventricular size is normal. No increase in right ventricular wall thickness. Right ventricular systolic function is normal. There is moderately elevated pulmonary artery systolic pressure. The tricuspid regurgitant velocity is 3.57 m/s, and with an assumed right atrial pressure of 8 mmHg, the estimated  right ventricular systolic pressure is 59.0 mmHg. Left Atrium: Left atrial size was mild to moderately dilated. Right Atrium: Right atrial size was normal in size. Pericardium: There is no evidence of pericardial effusion. Mitral Valve: The mitral valve is abnormal. There is mild calcification of the mitral valve leaflet(s). Mild mitral annular calcification. Mild to moderate mitral valve regurgitation. Tricuspid Valve: The tricuspid valve is grossly normal. Tricuspid valve regurgitation is mild. Aortic Valve: The aortic valve is tricuspid. There is moderate  calcification of the aortic valve. Aortic valve regurgitation is trivial. Moderate to severe aortic stenosis is present. Aortic valve mean gradient measures 31.0 mmHg. Aortic valve peak gradient measures 47.5 mmHg. Aortic valve area, by VTI measures 0.71 cm. Pulmonic Valve: The pulmonic valve was not well visualized. Pulmonic valve regurgitation is trivial. Aorta: The aortic root is normal in size and structure. Venous: The inferior vena cava is normal in size with less than 50% respiratory variability, suggesting right atrial pressure of 8 mmHg. IAS/Shunts: No atrial level shunt detected by color flow Doppler.  LEFT VENTRICLE PLAX 2D LVIDd:         4.30 cm LVIDs:         3.00 cm LV PW:         1.00 cm LV IVS:        1.00 cm LVOT diam:     1.90 cm LV SV:         50 LV SV Index:   35 LVOT Area:     2.84 cm  LV Volumes (MOD) LV vol d, MOD A4C: 103.0 ml LV vol s, MOD A4C: 53.4 ml LV SV MOD A4C:     103.0 ml RIGHT VENTRICLE          IVC RV Basal diam:  2.70 cm  IVC diam: 2.00 cm TAPSE (M-mode): 2.5 cm LEFT ATRIUM             Index        RIGHT ATRIUM           Index LA diam:        3.30 cm 2.27 cm/m   RA Area:     16.00 cm LA Vol (A2C):   69.6 ml 47.85 ml/m  RA Volume:   39.00 ml  26.81 ml/m LA Vol (A4C):   59.0 ml 40.56 ml/m LA Biplane Vol: 66.1 ml 45.44 ml/m  AORTIC VALVE AV Area (Vmax):    0.83 cm AV Area (Vmean):   0.71 cm AV Area (VTI):     0.71 cm AV Vmax:           344.67 cm/s AV Vmean:          239.667 cm/s AV VTI:            0.715 m AV Peak Grad:      47.5 mmHg AV Mean Grad:      31.0 mmHg LVOT Vmax:         101.00 cm/s LVOT Vmean:        60.200 cm/s LVOT VTI:          0.178 m LVOT/AV VTI ratio: 0.25  AORTA Ao Root diam: 3.20 cm Ao Asc diam:  3.10 cm TRICUSPID VALVE TR Peak grad:   51.0 mmHg TR Vmax:        357.00 cm/s  SHUNTS Systemic VTI:  0.18 m Systemic Diam: 1.90 cm Nona Dell MD Electronically signed by Nona Dell MD Signature Date/Time: 12/22/2021/11:56:11 AM    Final  DG Abd 1  View  Result Date: 12/21/2021 CLINICAL DATA:  Distension possible sepsis EXAM: ABDOMEN - 1 VIEW COMPARISON:  11/27/2017 FINDINGS: Overall decreased bowel gas with some scattered colon gas in the right lower quadrant. No radiopaque calculi. Hardware in the left hip. IMPRESSION: Overall nonobstructed gas pattern. Electronically Signed   By: Jasmine Pang M.D.   On: 12/21/2021 19:18   DG Chest Port 1 View  Result Date: 12/21/2021 CLINICAL DATA:  Possible sepsis. EXAM: PORTABLE CHEST 1 VIEW COMPARISON:  07/13/2021. FINDINGS: Cardiac silhouette is mildly enlarged. No mediastinal or hilar masses. Lungs are clear.  No convincing pleural effusion.  No pneumothorax. Skeletal structures are demineralized, grossly intact. IMPRESSION: No acute cardiopulmonary disease. Electronically Signed   By: Amie Portland M.D.   On: 12/21/2021 16:08    Labs: BNP (last 3 results) No results for input(s): "BNP" in the last 8760 hours. Basic Metabolic Panel: Recent Labs  Lab 12/21/21 1504 12/21/21 2117 12/22/21 0828 12/23/21 0438 12/24/21 0510 12/25/21 0619  NA 138  --  137 136 137 136  K 3.7  --  3.4* 4.1 4.1 4.2  CL 103  --  106 106 104 101  CO2 28  --  GLUCOSE 169*  --  109* 100* 105* 97  BUN 14  --  CREATININE 0.59  --  0.52 0.57 0.55 0.48  CALCIUM 8.9  --  8.1* 8.3* 8.5* 8.3*  MG  --  2.0 1.8 1.9 1.7  --   PHOS  --  2.6 2.6 2.3*  --   --    Liver Function Tests: Recent Labs  Lab 12/21/21 1504 12/22/21 0828  AST 22 16  ALT 15 13  ALKPHOS 88 72  BILITOT 1.0 0.9  PROT 7.3 6.1*  ALBUMIN 3.3* 2.7*   No results for input(s): "LIPASE", "AMYLASE" in the last 168 hours. No results for input(s): "AMMONIA" in the last 168 hours. CBC: Recent Labs  Lab 12/21/21 1504 12/22/21 0828 12/23/21 0438 12/24/21 0510 12/25/21 0824  WBC 11.9* 7.4 7.2 5.9 2.6*  NEUTROABS 10.6*  --   --   --   --   HGB 12.7 11.3* 11.6* 11.6* 10.3*  HCT 38.8 35.4* 36.2 35.7* 32.8*  MCV 94.6 96.5 95.8  94.9 95.9  PLT 166 145* 176 167 244   Cardiac Enzymes: Recent Labs  Lab 12/21/21 2117  CKTOTAL 18*   BNP: Invalid input(s): "POCBNP" CBG: Recent Labs  Lab 12/24/21 1145 12/24/21 1720 12/24/21 2139 12/25/21 0748 12/25/21 1140  GLUCAP 134* 113* 126* 99 114*   D-Dimer No results for input(s): "DDIMER" in the last 72 hours. Hgb A1c No results for input(s): "HGBA1C" in the last 72 hours. Lipid Profile No results for input(s): "CHOL", "HDL", "LDLCALC", "TRIG", "CHOLHDL", "LDLDIRECT" in the last 72 hours. Thyroid function studies No results for input(s): "TSH", "T4TOTAL", "T3FREE", "THYROIDAB" in the last 72 hours.  Invalid input(s): "FREET3" Anemia work up No results for input(s): "VITAMINB12", "FOLATE", "FERRITIN", "TIBC", "IRON", "RETICCTPCT" in the last 72 hours. Urinalysis    Component Value Date/Time   COLORURINE YELLOW 12/21/2021 1611   APPEARANCEUR TURBID (A) 12/21/2021 1611   LABSPEC 1.016 12/21/2021 1611   PHURINE 5.0 12/21/2021 1611   GLUCOSEU 50 (A) 12/21/2021 1611   HGBUR SMALL (A) 12/21/2021 1611   BILIRUBINUR NEGATIVE 12/21/2021 1611   KETONESUR 5 (A) 12/21/2021 1611   PROTEINUR 100 (A) 12/21/2021 1611   NITRITE POSITIVE (A) 12/21/2021 1611  LEUKOCYTESUR LARGE (A) 12/21/2021 1611   Sepsis Labs Recent Labs  Lab 12/22/21 0828 12/23/21 0438 12/24/21 0510 12/25/21 0824  WBC 7.4 7.2 5.9 2.6*   Microbiology Recent Results (from the past 240 hour(s))  Culture, blood (routine x 2)     Status: Abnormal   Collection Time: 12/21/21  3:04 PM   Specimen: BLOOD  Result Value Ref Range Status   Specimen Description   Final    BLOOD Performed at [facility], [address] [place], [place] [address]    Special Requests   Final    NONE Performed at [facility], [address], [place], [place] [address]    Culture  Setup Time   Final    GRAM NEGATIVE RODS AEROBIC BOTTLE ONLY Organism ID to follow CRITICAL  RESULT CALLED TO, READ BACK BY AND VERIFIED WITH: EClinton Sawyer PHARMD, AT 1822 12/22/21 D. VANHOOK Performed at [facility], [address], [place], [place] [address]    Culture ESCHERICHIA COLI (A)  Final   Report Status 12/24/2021 FINAL  Final   Organism ID, Bacteria ESCHERICHIA COLI  Final      Susceptibility   Escherichia coli - MIC*    AMPICILLIN >=32 RESISTANT Resistant     CEFAZOLIN <=4 SENSITIVE Sensitive     CEFEPIME <=0.12 SENSITIVE Sensitive     CEFTAZIDIME <=1 SENSITIVE Sensitive     CEFTRIAXONE <=0.25 SENSITIVE Sensitive     CIPROFLOXACIN >=4 RESISTANT Resistant     GENTAMICIN <=1 SENSITIVE Sensitive     IMIPENEM <=0.25 SENSITIVE Sensitive     TRIMETH/SULFA >=320 RESISTANT Resistant     AMPICILLIN/SULBACTAM >=32 RESISTANT Resistant     PIP/TAZO <=4 SENSITIVE Sensitive     * ESCHERICHIA COLI  Blood Culture ID Panel (Reflexed)     Status: Abnormal   Collection Time: 12/21/21  3:04 PM  Result Value Ref Range Status   Enterococcus faecalis NOT DETECTED NOT DETECTED Final   Enterococcus Faecium NOT DETECTED NOT DETECTED Final   Listeria monocytogenes NOT DETECTED NOT DETECTED Final   Staphylococcus species NOT DETECTED NOT DETECTED Final   Staphylococcus aureus (BCID) NOT DETECTED NOT DETECTED Final   Staphylococcus epidermidis NOT DETECTED NOT DETECTED Final   Staphylococcus lugdunensis NOT DETECTED NOT DETECTED Final   Streptococcus species NOT DETECTED NOT DETECTED Final   Streptococcus agalactiae NOT DETECTED NOT DETECTED Final   Streptococcus pneumoniae NOT DETECTED NOT DETECTED Final   Streptococcus pyogenes NOT DETECTED NOT DETECTED Final   A.calcoaceticus-baumannii NOT DETECTED NOT DETECTED Final   Bacteroides fragilis NOT DETECTED NOT DETECTED Final   Enterobacterales DETECTED (A) NOT DETECTED Final    Comment: Enterobacterales represent a large order of gram negative bacteria, not a single organism. CRITICAL RESULT CALLED TO, READ BACK BY AND VERIFIED  WITH: EClinton Sawyer PHARMD, AT 1822 12/22/21 D. VANHOOK    Enterobacter cloacae complex NOT DETECTED NOT DETECTED Final   Escherichia coli DETECTED (A) NOT DETECTED Final    Comment: CRITICAL RESULT CALLED TO, READ BACK BY AND VERIFIED WITH: EClinton Sawyer PHARMD, AT 1822 12/22/21 D. VANHOOK    Klebsiella aerogenes NOT DETECTED NOT DETECTED Final   Klebsiella oxytoca NOT DETECTED NOT DETECTED Final   Klebsiella pneumoniae NOT DETECTED NOT DETECTED Final   Proteus species NOT DETECTED NOT DETECTED Final   Salmonella species NOT DETECTED NOT DETECTED Final   Serratia marcescens NOT DETECTED NOT DETECTED Final   Haemophilus influenzae NOT DETECTED NOT DETECTED Final   Neisseria meningitidis  NOT DETECTED NOT DETECTED Final   Pseudomonas aeruginosa NOT DETECTED NOT DETECTED Final   Stenotrophomonas maltophilia NOT DETECTED NOT DETECTED Final   Candida albicans NOT DETECTED NOT DETECTED Final   Candida auris NOT DETECTED NOT DETECTED Final   Candida glabrata NOT DETECTED NOT DETECTED Final   Candida krusei NOT DETECTED NOT DETECTED Final   Candida parapsilosis NOT DETECTED NOT DETECTED Final   Candida tropicalis NOT DETECTED NOT DETECTED Final   Cryptococcus neoformans/gattii NOT DETECTED NOT DETECTED Final   CTX-M ESBL NOT DETECTED NOT DETECTED Final   Carbapenem resistance IMP NOT DETECTED NOT DETECTED Final   Carbapenem resistance KPC NOT DETECTED NOT DETECTED Final   Carbapenem resistance NDM NOT DETECTED NOT DETECTED Final   Carbapenem resist OXA 48 LIKE NOT DETECTED NOT DETECTED Final   Carbapenem resistance VIM NOT DETECTED NOT DETECTED Final    Comment: Performed at [facility], [address], [place], [place] [address]  Culture, blood (routine x 2)     Status: None (Preliminary result)   Collection Time: 12/21/21  3:10 PM   Specimen: BLOOD  Result Value Ref Range Status   Specimen Description   Final    BLOOD RIGHT ANTECUBITAL Performed at [facility],  [address], [place], [place] [address]    Special Requests   Final    BOTTLES DRAWN AEROBIC AND ANAEROBIC Blood Culture results may not be optimal due to an excessive volume of blood received in culture bottles Performed at [facility], [address], [place], [place] [address]    Culture   Final    NO GROWTH 4 DAYS Performed at [facility], [address], [place], [place] [address]    Report Status PENDING  Incomplete  Resp Panel by RT-PCR (Flu A&B, Covid) Anterior Nasal Swab     Status: None   Collection Time: 12/21/21  4:00 PM   Specimen: Anterior Nasal Swab  Result Value Ref Range Status   SARS Coronavirus 2 by RT PCR NEGATIVE NEGATIVE Final    Comment: (NOTE) SARS-CoV-2 target nucleic acids are NOT DETECTED.  The SARS-CoV-2 RNA is generally detectable in upper respiratory specimens during the acute phase of infection. The lowest concentration of SARS-CoV-2 viral copies this assay can detect is 138 copies/mL. A negative result does not preclude SARS-Cov-2 infection and should not be used as the sole basis for treatment or other patient management decisions. A negative result may occur with  improper specimen collection/handling, submission of specimen other than nasopharyngeal swab, presence of viral mutation(s) within the areas targeted by this assay, and inadequate number of viral copies(<138 copies/mL). A negative result must be combined with clinical observations, patient history, and epidemiological information. The expected result is Negative.  Fact Sheet for Patients:  BloggerCourse.com  Fact Sheet for Healthcare Providers:  SeriousBroker.it  This test is no t yet approved or cleared by the [place] FDA and  has been authorized for detection and/or diagnosis of SARS-CoV-2 by FDA under an Emergency Use Authorization (EUA). This EUA will remain  in effect (meaning this test can be  used) for the duration of the COVID-19 declaration under Section 564(b)(1) of the Act, 21 U.S.C.section 360bbb-3(b)(1), unless the authorization is terminated  or revoked sooner.       Influenza A by PCR NEGATIVE NEGATIVE Final   Influenza B by PCR NEGATIVE NEGATIVE Final    Comment: (NOTE) The Xpert Xpress SARS-CoV-2/FLU/RSV plus assay is intended as an aid  in the diagnosis of influenza from Nasopharyngeal swab specimens and should not be used as a sole basis for treatment. Nasal washings and aspirates are unacceptable for Xpert Xpress SARS-CoV-2/FLU/RSV testing.  Fact Sheet for Patients: BloggerCourse.com  Fact Sheet for Healthcare Providers: SeriousBroker.it  This test is not yet approved or cleared by the [place] FDA and has been authorized for detection and/or diagnosis of SARS-CoV-2 by FDA under an Emergency Use Authorization (EUA). This EUA will remain in effect (meaning this test can be used) for the duration of the COVID-19 declaration under Section 564(b)(1) of the Act, 21 U.S.C. section 360bbb-3(b)(1), unless the authorization is terminated or revoked.  Performed at [facility], [address], [place], [place] [address]   Urine Culture     Status: Abnormal   Collection Time: 12/21/21  7:19 PM   Specimen: Urine, Clean Catch  Result Value Ref Range Status   Specimen Description   Final    URINE, CLEAN CATCH Performed at [facility], [address] [place], [place] [address]    Special Requests   Final    NONE Performed at [facility], [address], [place], [place] [address]    Culture >=100,000 COLONIES/mL ESCHERICHIA COLI (A)  Final   Report Status 12/24/2021 FINAL  Final   Organism ID, Bacteria ESCHERICHIA COLI (A)  Final      Susceptibility   Escherichia coli - MIC*    AMPICILLIN >=32 RESISTANT Resistant     CEFAZOLIN <=4 SENSITIVE  Sensitive     CEFEPIME <=0.12 SENSITIVE Sensitive     CEFTRIAXONE <=0.25 SENSITIVE Sensitive     CIPROFLOXACIN >=4 RESISTANT Resistant     GENTAMICIN <=1 SENSITIVE Sensitive     IMIPENEM <=0.25 SENSITIVE Sensitive     NITROFURANTOIN <=16 SENSITIVE Sensitive     TRIMETH/SULFA >=320 RESISTANT Resistant     AMPICILLIN/SULBACTAM 16 INTERMEDIATE Intermediate     PIP/TAZO <=4 SENSITIVE Sensitive     * >=100,000 COLONIES/mL ESCHERICHIA COLI     Time coordinating discharge: 35 minutes  SIGNED: Lanae Boast, MD  [facility] 12/25/2021, 12:56 PM  If 7PM-7AM, please contact night-coverage [email or web address]

## 2021-12-25 NOTE — Progress Notes (Signed)
Report called to [facility]. Aware [facility] has been called for transport.

## 2021-12-25 NOTE — Progress Notes (Signed)
Speech Language Pathology Treatment: Dysphagia  Patient Details Name: Anne Miller MRN: [id number] DOB: 01-Jan-1919 Today's Date: 12/25/2021 Time: 6812-7517 SLP Time Calculation (min) (ACUTE ONLY): 13 min  Assessment / Plan / Recommendation Clinical Impression  SLP follow up indicated due to pt's plan to dc to SNF today and her HTL via tsp limits causing concern for dehydration. John, pt's son, in the room and SLP reviewed her MBS from 2 days ago with him,   He reports pt can no longer follow directions due to her current level of dementia.  Observed congested cough of pt without ability to clear - as she does not follow directions to cough and attempt to expectorate.  SLP discussed pt's elevated aspiration risk regarding of po intake due to her dysphagia and cognitive deficits.  At this time, given pt with only trace aspiration of nectar - and no pulmonary infection upon admit, recommend her diet return to nectar thick liquids. As pt has a strong swallow without severe retention, recommend incorporate Mare Ferrari water protocol to help her QOL and hydration/health.  Pt reports he believes pt likes the nectar liquids, but he does not feel like she has been offered drinks enough t/o the day.  Provided son with written information re: the water protocol.   Relayed concerns with Jenny Reichmann for pt to have recurrent aspiration pneunmonias and/or dehydration/malnutrition due to her dementia causing her to rely on others for po intake and her dysphagia.  Aspiration of HTL per research results in worsening pna and longer hospital stay.  Agree with recommendation for HTL in hospital but advise advance at next level of care.  Pt may benefit from palliative consult *if not already followed* to establish [place] given chronicity of dysphaga/aspiration.    HPI HPI: Pt is a 88 y.o. female who presented with change in mental status. CXR on admission negative and pt dx with UTI. PMH: dementia hypertension, GERD, HLD, CAD, type 2  diabetes. BSE 12/11/17: functional oropharyngeal swallow; rx regular/thin.  Pt underwent BSE with recommendation for dys1/nectar during prior eval. MBS completed showing delay in swallow and aspiration of thin, minimal asp of nectar, penetration of HTL.  Order for dc to SNF in place for today.  Follow up for education with son and pt indicated.      SLP Plan  Continue with current plan of care      Recommendations for follow up therapy are one component of a multi-disciplinary discharge planning process, led by the attending physician.  Recommendations may be updated based on patient status, additional functional criteria and insurance authorization.    Recommendations  Diet recommendations: Dysphagia 1 (puree);Nectar-thick liquid (frazier water protocol) Liquids provided via: Cup Medication Administration: Crushed with puree Supervision: Full supervision/cueing for compensatory strategies;Staff to assist with self feeding;Patient able to self feed Compensations: Slow rate;Small sips/bites;Minimize environmental distractions Postural Changes and/or Swallow Maneuvers: Seated upright 90 degrees;Upright 30-60 min after meal                Oral Care Recommendations: Oral care BID;Staff/trained caregiver to provide oral care Follow Up Recommendations: Skilled nursing-short term rehab (<3 hours/day) Assistance recommended at discharge: Frequent or constant Supervision/Assistance SLP Visit Diagnosis: Dysphagia, oropharyngeal phase (R13.12) Plan: Continue with current plan of care          Kathleen Lime, MS [place] Office [phone] Pager [phone]  Macario Golds  12/25/2021, 1:22 PM

## 2021-12-25 NOTE — TOC Transition Note (Signed)
Transition of Care [facility]) - CM/SW Discharge Note   Patient Details  Name: Anne Miller MRN: [id number] Date of Birth: 1918/07/11  [facility] CM/SW Contact:  Vassie Moselle, LCSW Phone Number: 12/25/2021, 10:14 AM   Clinical Narrative:    Pt it to [facility] she [facility] a long term resident of their SNF. Pt will be going to room 402b at the [facility]. RN to call report to [phone]. Pt will be transported via [facility].    Final next level of care: [place] Barriers to Discharge: No Barriers Identified   Patient Goals and CMS Choice Patient states their goals for this hospitalization and ongoing recovery are:: To return to [facility] CMS Medicare.gov Compare Post Acute Care list provided to:: Patient Choice offered to / list presented to : Patient, Adult Children  Discharge Placement   Existing PASRR number confirmed : 12/23/21            Patient to be transferred to facility by: [facility] Name of family member notified: Son, Viviann Broyles Patient and family notified of of transfer: 12/25/21  Discharge Plan and Services In-house Referral: NA Discharge Planning Services: CM Consult Post Acute Care Choice: [place]          DME Arranged: N/A DME Agency: NA                  Social Determinants of Health ([place]) Interventions     Readmission Risk Interventions    12/25/2021   10:06 AM 12/23/2021   10:19 AM  Readmission Risk Prevention Plan  Post Dischage Appt Complete Complete  Medication Screening Complete Complete  Transportation Screening Complete Complete

## 2021-12-26 LAB — CULTURE, BLOOD (ROUTINE X 2): Culture: NO GROWTH

## 2022-07-23 DEATH — deceased

## 2023-01-20 IMAGING — DX DG CHEST 1V PORT
1 series · 1 of 1 positions shown · non-contrast
Comparison: Chest two views 12/09/2017, 05/31/2017

CLINICAL DATA: Altered mental status.  Bradycardia and hypotension.

EXAM:
PORTABLE CHEST 1 VIEW

[chest ap]
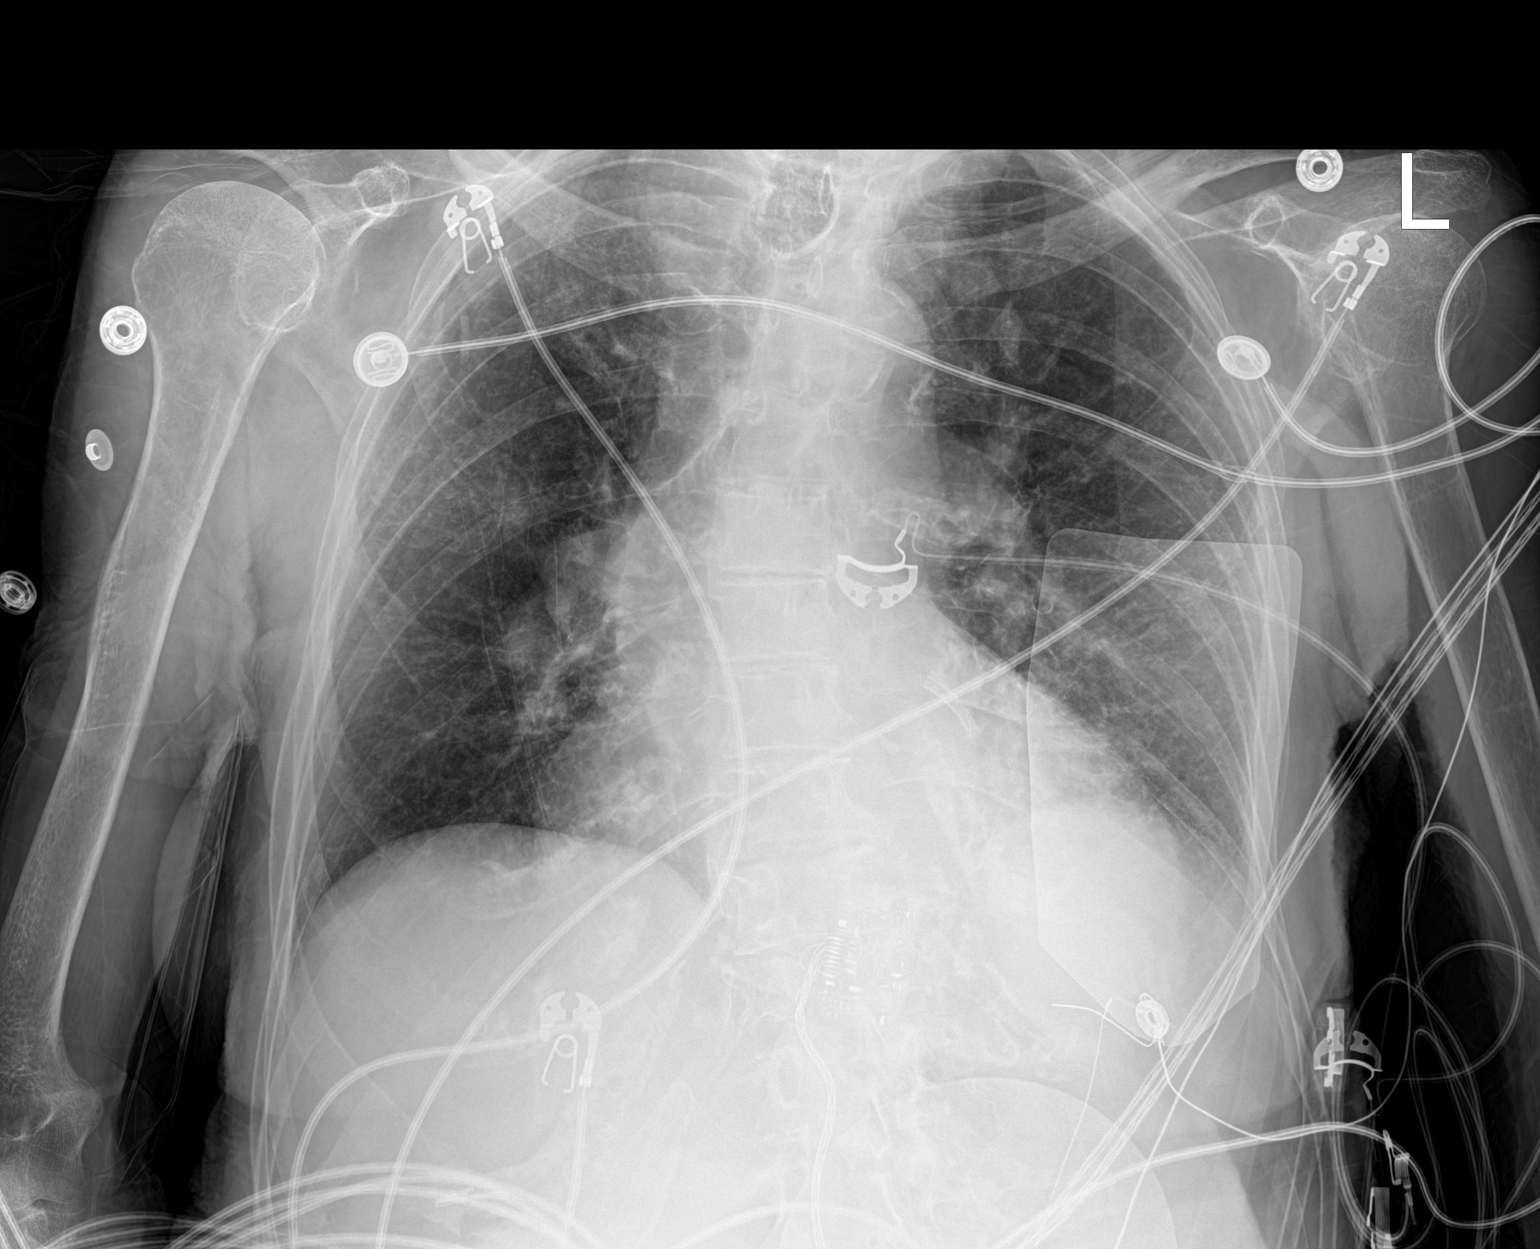

[1 of 1 positions shown; findings below may reference images not displayed]

FINDINGS: Cardiac silhouette is again moderately enlarged. Mildly tortuous
thoracic aorta is again seen. Mild calcification within the aortic
arch. A transcutaneous cardiac pacing pad overlies the left lower
hemithorax.

Mild chronic interstitial thickening, similar to prior. Improved
aeration of the right lung base. Likely small left pleural effusion
with associated left basilar opacification, similar to 12/09/2017.
No pneumothorax. Mild dextrocurvature of the mid to upper thoracic
spine and mild levocurvature of the lumbar spine. There appears to
be high-grade height loss of a lower thoracic vertebral body
appearing unchanged from 12/09/2017 and 05/31/2017 radiographs.
IMPRESSION: Improved aeration of the right lung base with mild left basilar
opacity that is similar to 12/09/2017 but likely increased from
05/31/2017. This may represent a small left pleural effusion with
associated atelectasis versus pneumonia.
